# Patient Record
Sex: Female | Born: 1960 | Race: White | Hispanic: No | Marital: Married | Smoking: Never smoker
Health system: Southern US, Community
[De-identification: ages and names within clinical notes are randomized; demographics above are authoritative.]

## PROBLEM LIST (undated history)

## (undated) DIAGNOSIS — N8501 Benign endometrial hyperplasia: Secondary | ICD-10-CM

## (undated) DIAGNOSIS — Z9889 Other specified postprocedural states: Secondary | ICD-10-CM

## (undated) DIAGNOSIS — K219 Gastro-esophageal reflux disease without esophagitis: Secondary | ICD-10-CM

## (undated) DIAGNOSIS — C50919 Malignant neoplasm of unspecified site of unspecified female breast: Secondary | ICD-10-CM

## (undated) DIAGNOSIS — R112 Nausea with vomiting, unspecified: Secondary | ICD-10-CM

## (undated) DIAGNOSIS — D249 Benign neoplasm of unspecified breast: Secondary | ICD-10-CM

## (undated) HISTORY — DX: Other specified postprocedural states: Z98.890

## (undated) HISTORY — DX: Nausea with vomiting, unspecified: R11.2

## (undated) HISTORY — DX: Malignant neoplasm of unspecified site of unspecified female breast: C50.919

## (undated) HISTORY — DX: Gastro-esophageal reflux disease without esophagitis: K21.9

## (undated) HISTORY — DX: Benign neoplasm of unspecified breast: D24.9

## (undated) HISTORY — PX: COLONOSCOPY: SHX174

## (undated) HISTORY — DX: Benign endometrial hyperplasia: N85.01

---
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
## 1999-01-02 ENCOUNTER — Ambulatory Visit (HOSPITAL_COMMUNITY): Admission: RE | Admit: 1999-01-02 | Discharge: 1999-01-02 | Payer: Self-pay

## 2000-10-02 ENCOUNTER — Encounter: Payer: Self-pay | Admitting: Obstetrics and Gynecology

## 2000-10-02 ENCOUNTER — Encounter: Admission: RE | Admit: 2000-10-02 | Discharge: 2000-10-02 | Payer: Self-pay | Admitting: Obstetrics and Gynecology

## 2001-03-06 ENCOUNTER — Encounter: Payer: Self-pay | Admitting: Gynecology

## 2001-03-06 ENCOUNTER — Encounter: Admission: RE | Admit: 2001-03-06 | Discharge: 2001-03-06 | Payer: Self-pay | Admitting: Gynecology

## 2001-08-27 ENCOUNTER — Encounter: Admission: RE | Admit: 2001-08-27 | Discharge: 2001-08-27 | Payer: Self-pay | Admitting: Gynecology

## 2001-08-27 ENCOUNTER — Encounter: Payer: Self-pay | Admitting: Gynecology

## 2001-10-07 ENCOUNTER — Other Ambulatory Visit: Admission: RE | Admit: 2001-10-07 | Discharge: 2001-10-07 | Payer: Self-pay | Admitting: Gynecology

## 2001-10-14 ENCOUNTER — Encounter: Payer: Self-pay | Admitting: Gynecology

## 2001-10-14 ENCOUNTER — Encounter: Admission: RE | Admit: 2001-10-14 | Discharge: 2001-10-14 | Payer: Self-pay | Admitting: Gynecology

## 2002-10-22 ENCOUNTER — Other Ambulatory Visit: Admission: RE | Admit: 2002-10-22 | Discharge: 2002-10-22 | Payer: Self-pay | Admitting: Gynecology

## 2002-10-26 ENCOUNTER — Encounter: Admission: RE | Admit: 2002-10-26 | Discharge: 2002-10-26 | Payer: Self-pay | Admitting: Gynecology

## 2002-10-26 ENCOUNTER — Encounter: Payer: Self-pay | Admitting: Gynecology

## 2002-11-12 DIAGNOSIS — D249 Benign neoplasm of unspecified breast: Secondary | ICD-10-CM

## 2002-11-12 HISTORY — DX: Benign neoplasm of unspecified breast: D24.9

## 2003-04-22 ENCOUNTER — Encounter: Payer: Self-pay | Admitting: Gynecology

## 2003-04-22 ENCOUNTER — Encounter: Admission: RE | Admit: 2003-04-22 | Discharge: 2003-04-22 | Payer: Self-pay | Admitting: Gynecology

## 2003-11-16 ENCOUNTER — Other Ambulatory Visit: Admission: RE | Admit: 2003-11-16 | Discharge: 2003-11-16 | Payer: Self-pay | Admitting: Gynecology

## 2003-11-23 ENCOUNTER — Encounter: Admission: RE | Admit: 2003-11-23 | Discharge: 2003-11-23 | Payer: Self-pay | Admitting: Gynecology

## 2004-05-25 ENCOUNTER — Encounter: Admission: RE | Admit: 2004-05-25 | Discharge: 2004-05-25 | Payer: Self-pay | Admitting: Gynecology

## 2004-11-22 ENCOUNTER — Other Ambulatory Visit: Admission: RE | Admit: 2004-11-22 | Discharge: 2004-11-22 | Payer: Self-pay | Admitting: Gynecology

## 2004-12-13 ENCOUNTER — Encounter: Admission: RE | Admit: 2004-12-13 | Discharge: 2004-12-13 | Payer: Self-pay | Admitting: Gynecology

## 2005-06-14 ENCOUNTER — Encounter: Admission: RE | Admit: 2005-06-14 | Discharge: 2005-06-14 | Payer: Self-pay | Admitting: Gynecology

## 2005-12-11 ENCOUNTER — Other Ambulatory Visit: Admission: RE | Admit: 2005-12-11 | Discharge: 2005-12-11 | Payer: Self-pay | Admitting: Gynecology

## 2006-01-29 ENCOUNTER — Encounter: Admission: RE | Admit: 2006-01-29 | Discharge: 2006-01-29 | Payer: Self-pay | Admitting: Gynecology

## 2006-11-12 DIAGNOSIS — C50919 Malignant neoplasm of unspecified site of unspecified female breast: Secondary | ICD-10-CM

## 2006-11-12 HISTORY — DX: Malignant neoplasm of unspecified site of unspecified female breast: C50.919

## 2006-11-12 HISTORY — PX: OTHER SURGICAL HISTORY: SHX169

## 2007-02-05 ENCOUNTER — Other Ambulatory Visit: Admission: RE | Admit: 2007-02-05 | Discharge: 2007-02-05 | Payer: Self-pay | Admitting: Obstetrics and Gynecology

## 2007-02-19 ENCOUNTER — Encounter: Admission: RE | Admit: 2007-02-19 | Discharge: 2007-02-19 | Payer: Self-pay | Admitting: Obstetrics and Gynecology

## 2007-03-04 ENCOUNTER — Encounter: Admission: RE | Admit: 2007-03-04 | Discharge: 2007-03-04 | Payer: Self-pay | Admitting: Obstetrics and Gynecology

## 2007-03-04 ENCOUNTER — Encounter (INDEPENDENT_AMBULATORY_CARE_PROVIDER_SITE_OTHER): Payer: Self-pay | Admitting: Specialist

## 2007-03-10 ENCOUNTER — Ambulatory Visit: Payer: Self-pay | Admitting: Oncology

## 2007-03-10 ENCOUNTER — Encounter: Admission: RE | Admit: 2007-03-10 | Discharge: 2007-03-10 | Payer: Self-pay | Admitting: Obstetrics and Gynecology

## 2007-03-11 ENCOUNTER — Encounter: Admission: RE | Admit: 2007-03-11 | Discharge: 2007-03-11 | Payer: Self-pay | Admitting: Obstetrics and Gynecology

## 2007-03-11 ENCOUNTER — Encounter (INDEPENDENT_AMBULATORY_CARE_PROVIDER_SITE_OTHER): Payer: Self-pay | Admitting: Specialist

## 2007-03-12 ENCOUNTER — Ambulatory Visit (HOSPITAL_COMMUNITY): Admission: RE | Admit: 2007-03-12 | Discharge: 2007-03-12 | Payer: Self-pay | Admitting: Oncology

## 2007-03-18 ENCOUNTER — Ambulatory Visit (HOSPITAL_COMMUNITY): Admission: RE | Admit: 2007-03-18 | Discharge: 2007-03-18 | Payer: Self-pay | Admitting: Oncology

## 2007-03-19 LAB — CBC WITH DIFFERENTIAL/PLATELET
Basophils Absolute: 0 10*3/uL (ref 0.0–0.1)
EOS%: 0.6 % (ref 0.0–7.0)
Eosinophils Absolute: 0 10*3/uL (ref 0.0–0.5)
HCT: 41.2 % (ref 34.8–46.6)
HGB: 14.6 g/dL (ref 11.6–15.9)
LYMPH%: 23.9 % (ref 14.0–48.0)
MCH: 32.7 pg (ref 26.0–34.0)
MCV: 92.3 fL (ref 81.0–101.0)
MONO%: 6 % (ref 0.0–13.0)
NEUT%: 69.1 % (ref 39.6–76.8)
Platelets: 242 10*3/uL (ref 145–400)
RDW: 12.3 % (ref 11.3–14.5)

## 2007-03-19 LAB — COMPREHENSIVE METABOLIC PANEL
AST: 18 U/L (ref 0–37)
Alkaline Phosphatase: 41 U/L (ref 39–117)
BUN: 11 mg/dL (ref 6–23)
Creatinine, Ser: 0.85 mg/dL (ref 0.40–1.20)
Glucose, Bld: 133 mg/dL — ABNORMAL HIGH (ref 70–99)
Total Bilirubin: 0.5 mg/dL (ref 0.3–1.2)

## 2007-03-19 LAB — CANCER ANTIGEN 27.29: CA 27.29: 32 U/mL (ref 0–39)

## 2007-03-20 ENCOUNTER — Ambulatory Visit (HOSPITAL_COMMUNITY): Admission: RE | Admit: 2007-03-20 | Discharge: 2007-03-20 | Payer: Self-pay | Admitting: Oncology

## 2007-03-21 ENCOUNTER — Ambulatory Visit (HOSPITAL_COMMUNITY): Admission: RE | Admit: 2007-03-21 | Discharge: 2007-03-21 | Payer: Self-pay | Admitting: Oncology

## 2007-04-13 HISTORY — PX: OTHER SURGICAL HISTORY: SHX169

## 2007-04-23 ENCOUNTER — Ambulatory Visit (HOSPITAL_COMMUNITY): Admission: RE | Admit: 2007-04-23 | Discharge: 2007-04-25 | Payer: Self-pay | Admitting: Obstetrics and Gynecology

## 2007-04-23 ENCOUNTER — Encounter (INDEPENDENT_AMBULATORY_CARE_PROVIDER_SITE_OTHER): Payer: Self-pay | Admitting: Surgery

## 2007-04-29 ENCOUNTER — Ambulatory Visit: Payer: Self-pay | Admitting: Oncology

## 2007-05-19 ENCOUNTER — Ambulatory Visit: Admission: RE | Admit: 2007-05-19 | Discharge: 2007-06-17 | Payer: Self-pay | Admitting: Radiation Oncology

## 2007-07-31 ENCOUNTER — Ambulatory Visit: Payer: Self-pay | Admitting: Oncology

## 2007-09-25 IMAGING — CR DG CHEST 2V
2 series · 2 of 2 positions shown · non-contrast
Comparison: none

CLINICAL DATA: Breast cancer.  Pre-op.
 CHEST - 2 VIEW:

[view not recorded (1 of 2)]
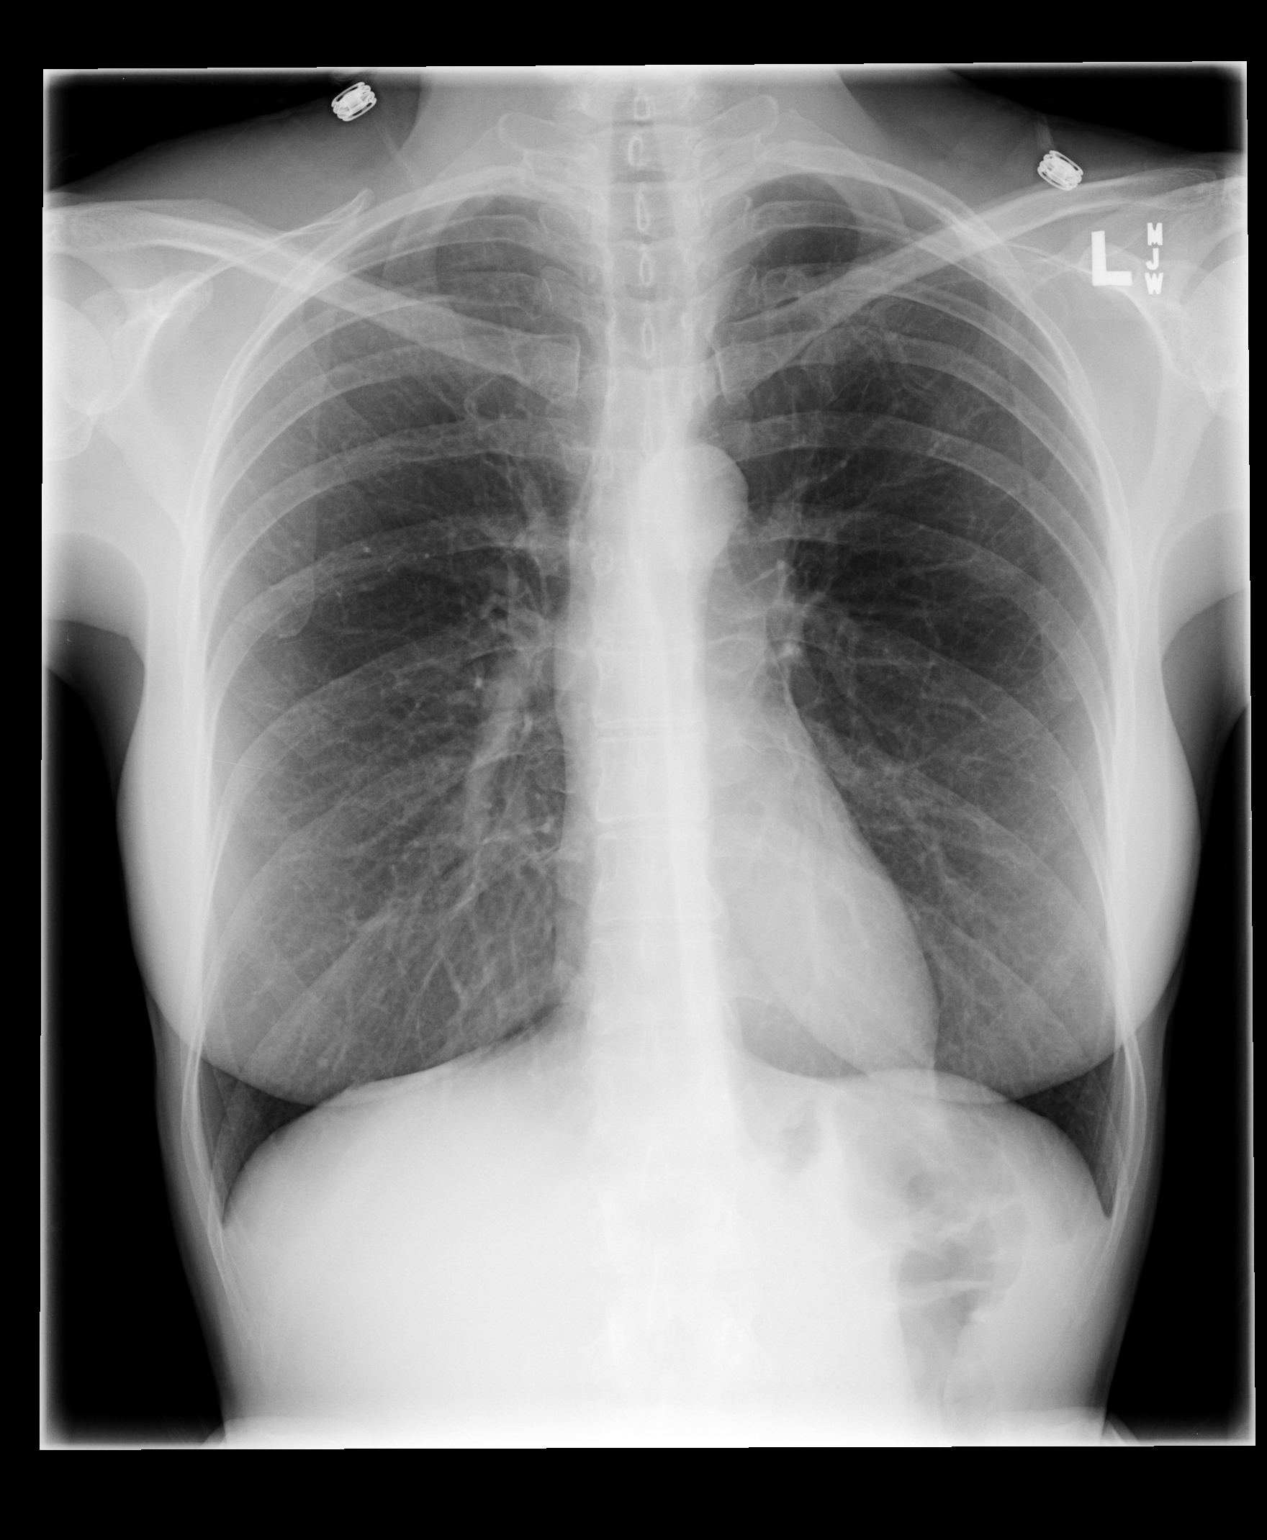

[view not recorded (2 of 2)]
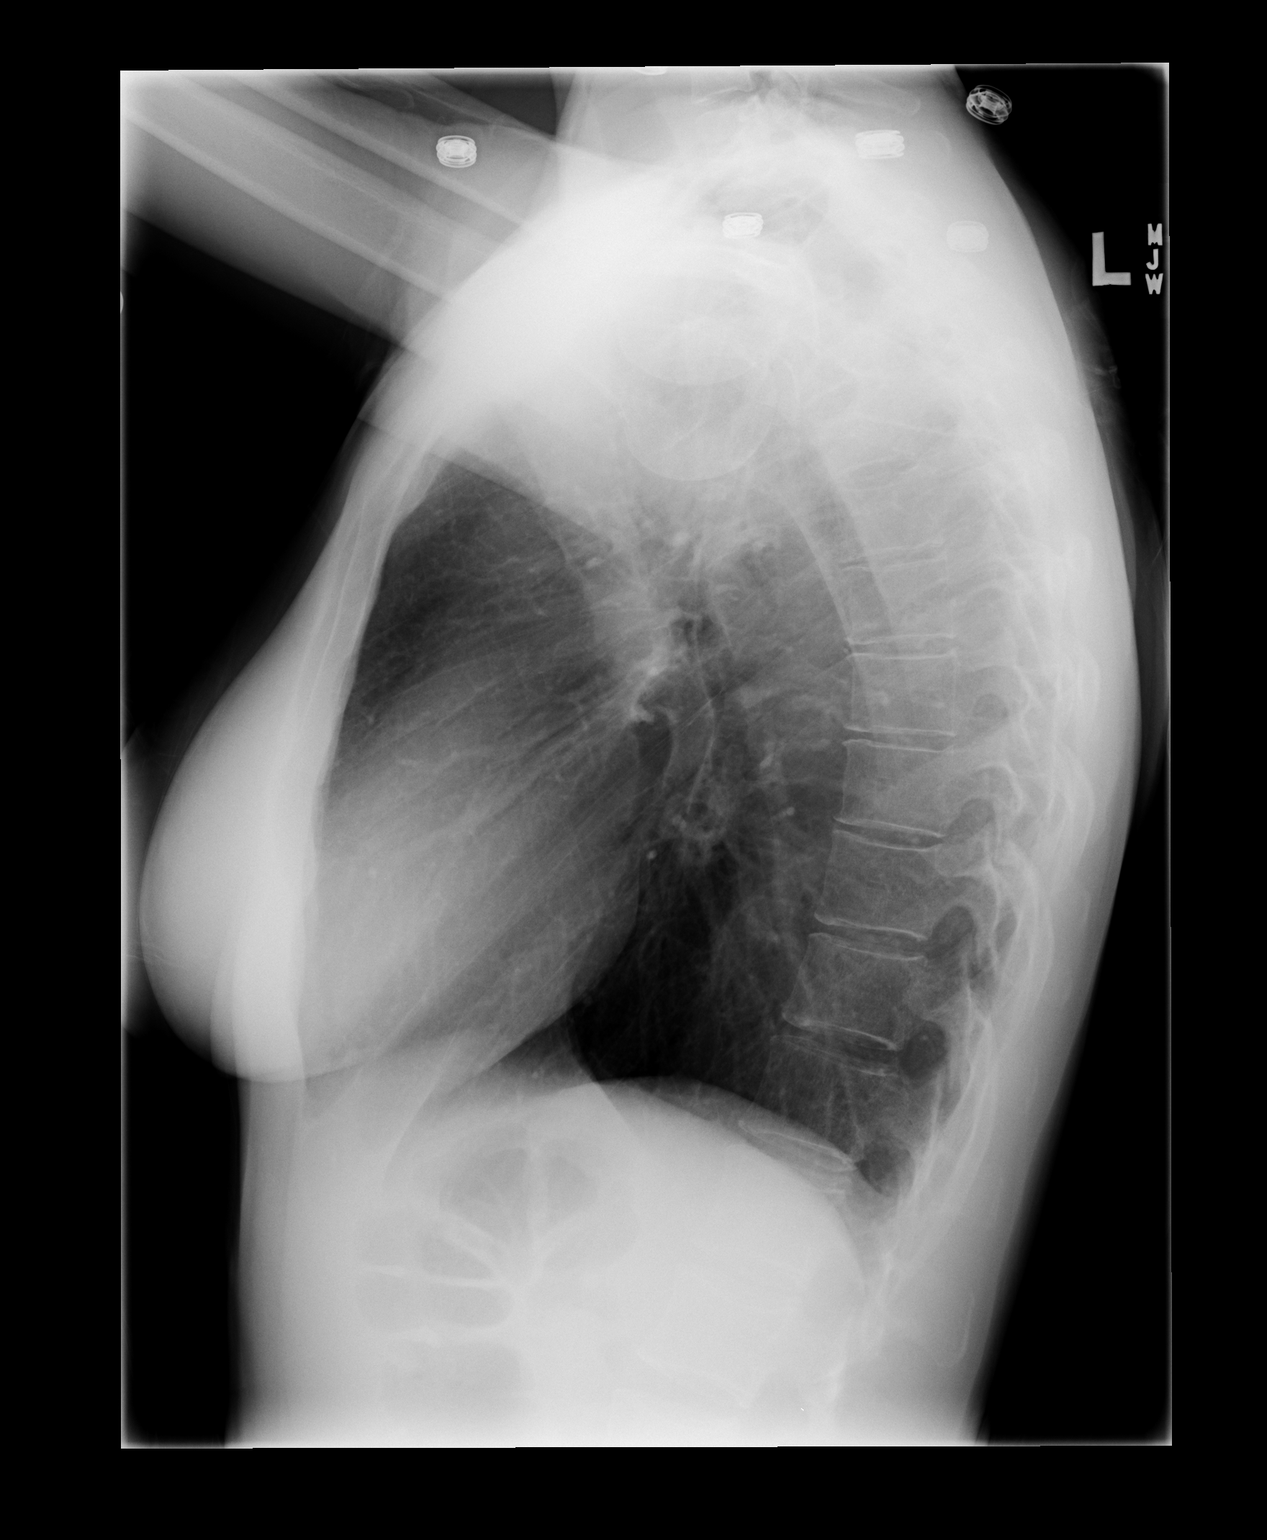

[2 of 2 positions shown; findings below may reference images not displayed]

FINDINGS: Heart size is normal.  There is no infiltrate, effusion or mass.  No lung nodules are identified.
IMPRESSION: No acute abnormality.

## 2008-01-16 ENCOUNTER — Ambulatory Visit: Payer: Self-pay | Admitting: Oncology

## 2008-01-20 LAB — CBC WITH DIFFERENTIAL/PLATELET
BASO%: 0.4 % (ref 0.0–2.0)
Basophils Absolute: 0 10*3/uL (ref 0.0–0.1)
EOS%: 2.3 % (ref 0.0–7.0)
HCT: 37 % (ref 34.8–46.6)
HGB: 12.9 g/dL (ref 11.6–15.9)
MCH: 32.6 pg (ref 26.0–34.0)
MCHC: 35 g/dL (ref 32.0–36.0)
MONO#: 0.4 10*3/uL (ref 0.1–0.9)
NEUT%: 60.7 % (ref 39.6–76.8)
RDW: 12.4 % (ref 11.3–14.5)
WBC: 6.1 10*3/uL (ref 3.9–10.0)
lymph#: 1.9 10*3/uL (ref 0.9–3.3)

## 2008-01-21 LAB — COMPREHENSIVE METABOLIC PANEL
ALT: 17 U/L (ref 0–35)
AST: 22 U/L (ref 0–37)
CO2: 28 mEq/L (ref 19–32)
Chloride: 103 mEq/L (ref 96–112)
Sodium: 140 mEq/L (ref 135–145)
Total Bilirubin: 0.4 mg/dL (ref 0.3–1.2)
Total Protein: 6.6 g/dL (ref 6.0–8.3)

## 2008-01-21 LAB — VITAMIN D 25 HYDROXY (VIT D DEFICIENCY, FRACTURES): Vit D, 25-Hydroxy: 39 ng/mL (ref 30–89)

## 2008-01-23 LAB — VITAMIN D 1,25 DIHYDROXY: Vit D, 1,25-Dihydroxy: 63 pg/mL — ABNORMAL HIGH (ref 6–62)

## 2008-02-11 ENCOUNTER — Other Ambulatory Visit: Admission: RE | Admit: 2008-02-11 | Discharge: 2008-02-11 | Payer: Self-pay | Admitting: Obstetrics and Gynecology

## 2008-07-27 ENCOUNTER — Ambulatory Visit: Payer: Self-pay | Admitting: Oncology

## 2008-07-29 LAB — CBC WITH DIFFERENTIAL/PLATELET
Basophils Absolute: 0 10*3/uL (ref 0.0–0.1)
EOS%: 0.9 % (ref 0.0–7.0)
Eosinophils Absolute: 0 10*3/uL (ref 0.0–0.5)
HCT: 39.3 % (ref 34.8–46.6)
HGB: 14 g/dL (ref 11.6–15.9)
MONO#: 0.3 10*3/uL (ref 0.1–0.9)
NEUT#: 2.6 10*3/uL (ref 1.5–6.5)
NEUT%: 57.6 % (ref 39.6–76.8)
RDW: 12.5 % (ref 11.3–14.5)
WBC: 4.5 10*3/uL (ref 3.9–10.0)
lymph#: 1.6 10*3/uL (ref 0.9–3.3)

## 2008-07-30 LAB — COMPREHENSIVE METABOLIC PANEL
Alkaline Phosphatase: 41 U/L (ref 39–117)
BUN: 11 mg/dL (ref 6–23)
CO2: 27 mEq/L (ref 19–32)
Glucose, Bld: 96 mg/dL (ref 70–99)
Total Bilirubin: 0.4 mg/dL (ref 0.3–1.2)

## 2008-07-30 LAB — VITAMIN D 25 HYDROXY (VIT D DEFICIENCY, FRACTURES): Vit D, 25-Hydroxy: 43 ng/mL (ref 30–89)

## 2008-07-30 LAB — CANCER ANTIGEN 27.29: CA 27.29: 29 U/mL (ref 0–39)

## 2009-01-24 ENCOUNTER — Ambulatory Visit: Payer: Self-pay | Admitting: Oncology

## 2009-01-26 LAB — COMPREHENSIVE METABOLIC PANEL
Alkaline Phosphatase: 28 U/L — ABNORMAL LOW (ref 39–117)
CO2: 29 mEq/L (ref 19–32)
Creatinine, Ser: 0.8 mg/dL (ref 0.40–1.20)
Glucose, Bld: 90 mg/dL (ref 70–99)
Sodium: 139 mEq/L (ref 135–145)
Total Bilirubin: 0.5 mg/dL (ref 0.3–1.2)
Total Protein: 6.7 g/dL (ref 6.0–8.3)

## 2009-01-26 LAB — CBC WITH DIFFERENTIAL/PLATELET
Eosinophils Absolute: 0.1 10*3/uL (ref 0.0–0.5)
HCT: 37.4 % (ref 34.8–46.6)
LYMPH%: 35.8 % (ref 14.0–49.7)
MCHC: 35.5 g/dL (ref 31.5–36.0)
MCV: 95.2 fL (ref 79.5–101.0)
MONO#: 0.3 10*3/uL (ref 0.1–0.9)
MONO%: 6 % (ref 0.0–14.0)
NEUT%: 56 % (ref 38.4–76.8)
Platelets: 180 10*3/uL (ref 145–400)
RBC: 3.93 10*6/uL (ref 3.70–5.45)
WBC: 4.9 10*3/uL (ref 3.9–10.3)

## 2009-01-27 LAB — VITAMIN D 25 HYDROXY (VIT D DEFICIENCY, FRACTURES): Vit D, 25-Hydroxy: 39 ng/mL (ref 30–89)

## 2009-01-27 LAB — CANCER ANTIGEN 27.29: CA 27.29: 25 U/mL (ref 0–39)

## 2009-02-22 ENCOUNTER — Other Ambulatory Visit: Admission: RE | Admit: 2009-02-22 | Discharge: 2009-02-22 | Payer: Self-pay | Admitting: Obstetrics and Gynecology

## 2009-07-13 ENCOUNTER — Ambulatory Visit: Payer: Self-pay | Admitting: Oncology

## 2009-07-15 LAB — CBC WITH DIFFERENTIAL/PLATELET
Basophils Absolute: 0 10*3/uL (ref 0.0–0.1)
Eosinophils Absolute: 0.1 10*3/uL (ref 0.0–0.5)
HCT: 36.7 % (ref 34.8–46.6)
LYMPH%: 33.3 % (ref 14.0–49.7)
MONO#: 0.3 10*3/uL (ref 0.1–0.9)
NEUT#: 2.9 10*3/uL (ref 1.5–6.5)
NEUT%: 58.5 % (ref 38.4–76.8)
Platelets: 179 10*3/uL (ref 145–400)
WBC: 5 10*3/uL (ref 3.9–10.3)

## 2009-07-19 LAB — CANCER ANTIGEN 27.29: CA 27.29: 21 U/mL (ref 0–39)

## 2009-07-19 LAB — COMPREHENSIVE METABOLIC PANEL
BUN: 11 mg/dL (ref 6–23)
CO2: 25 mEq/L (ref 19–32)
Creatinine, Ser: 0.87 mg/dL (ref 0.40–1.20)
Glucose, Bld: 109 mg/dL — ABNORMAL HIGH (ref 70–99)
Total Bilirubin: 0.4 mg/dL (ref 0.3–1.2)
Total Protein: 6.6 g/dL (ref 6.0–8.3)

## 2009-07-19 LAB — LACTATE DEHYDROGENASE: LDH: 182 U/L (ref 94–250)

## 2010-01-20 ENCOUNTER — Ambulatory Visit: Payer: Self-pay | Admitting: Oncology

## 2010-01-24 LAB — CBC WITH DIFFERENTIAL/PLATELET
BASO%: 0.2 % (ref 0.0–2.0)
EOS%: 1.1 % (ref 0.0–7.0)
LYMPH%: 35.5 % (ref 14.0–49.7)
MCH: 32.4 pg (ref 25.1–34.0)
MCHC: 34.9 g/dL (ref 31.5–36.0)
MCV: 92.8 fL (ref 79.5–101.0)
MONO%: 6.2 % (ref 0.0–14.0)
Platelets: 163 10*3/uL (ref 145–400)
RBC: 4.17 10*6/uL (ref 3.70–5.45)
WBC: 5.6 10*3/uL (ref 3.9–10.3)

## 2010-01-24 LAB — COMPREHENSIVE METABOLIC PANEL
ALT: 19 U/L (ref 0–35)
Alkaline Phosphatase: 42 U/L (ref 39–117)
Creatinine, Ser: 0.85 mg/dL (ref 0.40–1.20)
Sodium: 139 mEq/L (ref 135–145)
Total Bilirubin: 0.4 mg/dL (ref 0.3–1.2)
Total Protein: 6.7 g/dL (ref 6.0–8.3)

## 2010-07-27 ENCOUNTER — Ambulatory Visit: Payer: Self-pay | Admitting: Oncology

## 2010-07-27 LAB — CBC WITH DIFFERENTIAL/PLATELET
EOS%: 1.2 % (ref 0.0–7.0)
Eosinophils Absolute: 0 10*3/uL (ref 0.0–0.5)
LYMPH%: 36.4 % (ref 14.0–49.7)
MCH: 33.1 pg (ref 25.1–34.0)
MCHC: 35 g/dL (ref 31.5–36.0)
MCV: 94.6 fL (ref 79.5–101.0)
MONO%: 7.8 % (ref 0.0–14.0)
NEUT#: 2.1 10*3/uL (ref 1.5–6.5)
Platelets: 190 10*3/uL (ref 145–400)
RBC: 3.94 10*6/uL (ref 3.70–5.45)

## 2010-07-28 LAB — COMPREHENSIVE METABOLIC PANEL
AST: 23 U/L (ref 0–37)
Alkaline Phosphatase: 40 U/L (ref 39–117)
Glucose, Bld: 104 mg/dL — ABNORMAL HIGH (ref 70–99)
Sodium: 140 mEq/L (ref 135–145)
Total Bilirubin: 0.4 mg/dL (ref 0.3–1.2)
Total Protein: 6.3 g/dL (ref 6.0–8.3)

## 2010-07-28 LAB — FOLLICLE STIMULATING HORMONE: FSH: 40.6 m[IU]/mL

## 2010-07-28 LAB — VITAMIN D 25 HYDROXY (VIT D DEFICIENCY, FRACTURES): Vit D, 25-Hydroxy: 47 ng/mL (ref 30–89)

## 2011-01-25 ENCOUNTER — Other Ambulatory Visit: Payer: Self-pay | Admitting: Oncology

## 2011-01-25 ENCOUNTER — Encounter (HOSPITAL_BASED_OUTPATIENT_CLINIC_OR_DEPARTMENT_OTHER): Payer: 59 | Admitting: Oncology

## 2011-01-25 DIAGNOSIS — Z170 Estrogen receptor positive status [ER+]: Secondary | ICD-10-CM

## 2011-01-25 DIAGNOSIS — C50419 Malignant neoplasm of upper-outer quadrant of unspecified female breast: Secondary | ICD-10-CM

## 2011-01-25 LAB — CBC WITH DIFFERENTIAL/PLATELET
BASO%: 0.2 % (ref 0.0–2.0)
Basophils Absolute: 0 10*3/uL (ref 0.0–0.1)
EOS%: 1.5 % (ref 0.0–7.0)
HCT: 37.7 % (ref 34.8–46.6)
HGB: 13.3 g/dL (ref 11.6–15.9)
LYMPH%: 41.3 % (ref 14.0–49.7)
MCH: 32.1 pg (ref 25.1–34.0)
MCHC: 35.3 g/dL (ref 31.5–36.0)
MONO#: 0.3 10*3/uL (ref 0.1–0.9)
NEUT%: 50.6 % (ref 38.4–76.8)
Platelets: 163 10*3/uL (ref 145–400)

## 2011-01-25 LAB — COMPREHENSIVE METABOLIC PANEL
AST: 25 U/L (ref 0–37)
Albumin: 3.9 g/dL (ref 3.5–5.2)
Alkaline Phosphatase: 39 U/L (ref 39–117)
Glucose, Bld: 94 mg/dL (ref 70–99)
Total Protein: 6.4 g/dL (ref 6.0–8.3)

## 2011-01-26 LAB — VITAMIN D 25 HYDROXY (VIT D DEFICIENCY, FRACTURES): Vit D, 25-Hydroxy: 50 ng/mL (ref 30–89)

## 2011-02-01 ENCOUNTER — Encounter (HOSPITAL_BASED_OUTPATIENT_CLINIC_OR_DEPARTMENT_OTHER): Payer: 59 | Admitting: Oncology

## 2011-02-01 DIAGNOSIS — C50419 Malignant neoplasm of upper-outer quadrant of unspecified female breast: Secondary | ICD-10-CM

## 2011-02-01 DIAGNOSIS — Z170 Estrogen receptor positive status [ER+]: Secondary | ICD-10-CM

## 2011-03-27 NOTE — Discharge Summary (Signed)
Erika Parsons, Erika Parsons NO.:  [id number]   MEDICAL RECORD NO.:  [id number]          PATIENT TYPE:  OIB   LOCATION:  5738                         FACILITY:  [facility]   PHYSICIAN:  Etter Sjogren, M.D.     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   PROCEDURE PERFORMED:  1. Bilateral mastectomy.  2. Right sentinel lymph node biopsy.  3. Bilateral breast reconstruction using tissue expanders.   See H&P.   HISTORY OF PRESENT ILLNESS:  A 50 year old woman with breast cancer who  desires reconstruction, having had mastectomy recommended to her.  The  nature of the procedure was discussed with her at length, and she  understood the risks and complications and wished to proceed.  For  further details of the patient's history and physical, please see the  chart.   HOSPITAL COURSE:  On admission, her metabolic panel was within normal  limits.  Alkaline phosphatase is a little low at 35.  White blood cell  count, hemoglobin and hematocrit are all within normal limits.  On the  day of admission she was taken to surgery, at which time the mastectomy,  bilateral, and breast reconstruction were performed.  It was tolerated  well.  Postoperatively she did well, afebrile, drains functioning.  Skin  flaps looked very healthy.  Ambulating well.  It was anticipated that  she would be discharged on the second postoperative day.   DISPOSITION:  She is discharged on the Percocet for pain, Robaxin, and  Keflex.  No shower yet.  Empty the drains three times a day and record  the amount.  Use incentive spirometer at home.  Will recheck next week.      Etter Sjogren, M.D.  Electronically Signed     DB/MEDQ  D:  04/25/2007  T:  04/25/2007  Job:  [id number]

## 2011-03-27 NOTE — Op Note (Signed)
Erika Parsons, Erika Parsons              ACCOUNT NO.:  [id number]   MEDICAL RECORD NO.:  [id number]          PATIENT TYPE:  OIB   LOCATION:  5738                         FACILITY:  [facility]   PHYSICIAN:  Sandria Bales. Ezzard Standing, M.D.  DATE OF BIRTH:  10-Jul-1961   DATE OF PROCEDURE:  04/23/2007  DATE OF DISCHARGE:                               OPERATIVE REPORT   PREOPERATIVE DIAGNOSES:  Approximately 2.7-cm carcinoma in upper outer  quadrant of right breast, with bilateral severe fibronodular breast  disease.   POSTOPERATIVE DIAGNOSES:  Approximately 2.7-cm carcinoma in upper outer  quadrant of right breast, severe bilateral fibronodular breast disease,  negative sentinel lymph nodes of right axilla.   PROCEDURES:  Bilateral simple mastectomies; right axillary sentinel  lymph node biopsy with counts of 2400, background counts 100; and  injection of methylene blue.  Dr. Odis Luster to place bilateral tissue expanders and will dictate that  portion of surgery.   SURGEON:  Sandria Bales. Ezzard Standing, M.D.   ANESTHESIA:  General.   ESTIMATED BLOOD LOSS:  150 mL.   INDICATIONS FOR PROCEDURE:  Erika Parsons is a 50 year old white female,  who is a patient of Dr. Purvis Sheffield, who on a recent physical exam and  ultrasound was found to have a mass in the upper outer quadrant of her  right breast, which measured 2.7 cm by ultrasound.  She underwent a core  biopsy, which showed an invasive mammary carcinoma.  The completing  prophylaxis with Erika Parsons is she has severe fibronodular disease of  both breasts, which made physical exam and radiologic examination  extremely difficult.   We talked out neoadjuvant therapy versus lumpectomy versus mastectomy.  She is seeing Dr. Marikay Alar Magrinat in consultation, and the agreement was  that because of her severe bilateral fibronodular disease and the  extreme difficulty in examining and following her breasts, she would be  best served with a mastectomy on the side of the  cancer and a  prophylactic mastectomy on the other side.  And we did do gene testing  and her BRCA1 and 2 have come back negative and she is aware of this.  We also talked about doing an axillary sentinel lymph node; if this is  positive, we go into a full axillary dissection.   The indications and potential complications of surgery were explained to  the patient.  The potential complications, again, include bleeding, of  which she is aware and does not want blood products transfused,  infection, nerve injury, lymphedema and recurrence of the tumor.   OPERATIVE NOTE:  The patient was placed in a supine position, given a  general endotracheal anesthetic.  She was given 1 g of Ancef at the  initiation of the procedure.  She had a Foley catheter in place.  She  had been marked by Dr. Odis Luster before, and I remarked the inframammary  folds of both breasts.  She had also been injected with 1 mCi of Sulfur  Colloid in the right breast for the sentinel lymph node, and I injected  about 2 mL of 40% methylene blue in the right breast.  I started with her benign side first, the left breast.  I made an  incision, which was elliptical, approximately 8 to 9 cm in length,  including her nipple.  I slanted these slightly towards her axilla  because of the size of her breasts, and took this out sharply,  developing flaps, medially to the sternum, inferiorly to the rectus  muscle, laterally to the latissimus dorsi, and superiorly to about 2  fingerbreadths below the clavicle.   I then excised the breast off the chest wall.  I put a suture in the  lateral aspect of the breast.  It was impressive how nodular this breast  was but how it came out as 1 unit, and there was very little  subcutaneous fat on her.   I then turned my attention to the opposite breast, again, making an  elliptical incision including the nipple, which was about 8 or 9 cm  slightly towards the axilla.  I had already identified a  right axillary  lymph node, which was hot.  I developed my upper flaps, and then I found  this and identified this sentinel lymph node with counts of about 2400  and background of about 100.  There actually were 2 nodes, which were  blue and hot, side by side.  I sent these off to [facility].  Dr. Laureen Ochs  said these were benign lymph nodes on touch prep, with final pathology  pending.   I then continued my breast dissection again, going cranial towards the  clavicle, about 2 fingerbreadths below the clavicle, medial to the  lateral edge of the sternum, inferiorly to the invested fascia of the  rectus muscle, laterally to the latissimus dorsi muscle.  Again, the  breast was 1 hard unit.  I did try to excise the skin at least kind of  partly over where this tumor was palpated, because her tumor was sort of  about the 10-o'clock position.  I again put a suture in the breast,  marking the lateral aspect of the right breast, and then excised this.   At this point, both breasts had been removed.  There was no bleeding.  The estimated blood loss was maybe about 100 to 150 mL, and Dr. Etter Sjogren will scrub in with plans to place tissue expanders and close the  wounds, and he will dictate from here on as far as the operation.   The patient tolerated this part of the procedure well.      Sandria Bales. Ezzard Standing, M.D.  Electronically Signed     DHN/MEDQ  D:  04/23/2007  T:  04/23/2007  Job:  [id number]   cc:   Valentino Hue. Magrinat, M.D.  Cynthia P. Romine, M.D.  Etter Sjogren, M.D.  Dr Anselmo Pickler

## 2011-03-27 NOTE — Op Note (Signed)
Erika Parsons, Erika NO.:  [id number]   MEDICAL RECORD NO.:  [id number]          PATIENT TYPE:  OIB   LOCATION:  5738                         FACILITY:  [facility]   PHYSICIAN:  Etter Sjogren, M.D.     DATE OF BIRTH:  02-04-1961   DATE OF PROCEDURE:  04/23/2007  DATE OF DISCHARGE:                               OPERATIVE REPORT   PREOPERATIVE DIAGNOSIS:  Breast cancer with bilateral acquired absence  of the breast.   POSTOPERATIVE DIAGNOSIS:  Breast cancer with bilateral acquired absence  of the breast.   PROCEDURE PERFORMED:  Bilateral breast reconstruction with tissue  expanders.   SURGEON:  Etter Sjogren, M.D.   ANESTHESIA:  General.   ESTIMATED BLOOD LOSS:  Minimal.   DRAINS:  Two Blakes on each side.   CLINICAL NOTE:  A 50 year old woman with right breast cancer, has a very  large tumor, very difficult breast to examine and she decided to have  bilateral mastectomy.  This was felt to be medically necessary.  She  desired breast reconstruction.  Options discussed.  She selected tissue  expansion as a planned stage procedure by placement of the implants.  She understood the procedure, the risks, the possible complications, the  overall convalescence and the fact that these were planned stage  procedures and wished to proceed.   DESCRIPTION OF PROCEDURE:  The bilateral mastectomies had been completed  by Dr. Ovidio Kin.  The dissection was carried deep through the  pectoralis major muscles.  Submuscular pocket developed.  Thorough  irrigation with saline, meticulous hemostasis with electrocautery.  The  HD Flex from the [employer] was then placed with the dermal side up  towards the overlying skin flap and secured with a running 3-0 PDS  suture to the lateral border of the pectoralis major muscle.  Again,  after irrigation with saline, Blake drains in position, brought through  separate stab wounds inferiorly and secured with 3-0 plain sutures.  The  tissue expander was prepared after thoroughly cleaning gloves.  The  tissue expanders were prepared by removing the air and placing 100 mL of  sterile saline in the closed filling system.  The expanders were soaked  in antibiotic solution for greater than 5 minutes.  These were Allergan  tissue expanders 400 mL on the left side lot #[id number], on the right  side lot #[id number].  The 100 mL of sterile saline had been placed.  The  implants were positioned.  Care was taken to position them all the way  down to the inferiormost aspect of the submuscular pocket at the  inframammary fold.  Care was taken to make sure there were no wrinkles  or irregularities in the positioning.  This being the case, the lateral  border of the HD Flex was then secured to the musculature using 3-0 PDS  simple running suture, taking great care to avoid damage to the  underlying tissue expander.  Excellent hemostasis having been confirmed,  the skin closure with a few 3-0 Monocryl interrupted inverted deep  dermal sutures and a running 2-0 PDO Quill suture.  Steri-Strips, dry  sterile dressing and a protector applied, transported to the recovery  having tolerated the procedure well.      Etter Sjogren, M.D.  Electronically Signed     DB/MEDQ  D:  04/23/2007  T:  04/23/2007  Job:  [id number]

## 2011-05-17 ENCOUNTER — Encounter: Payer: Self-pay | Admitting: Gastroenterology

## 2011-06-21 ENCOUNTER — Ambulatory Visit (AMBULATORY_SURGERY_CENTER): Payer: 59 | Admitting: *Deleted

## 2011-06-21 ENCOUNTER — Encounter: Payer: Self-pay | Admitting: Gastroenterology

## 2011-06-21 VITALS — Ht 64.0 in | Wt 119.9 lb

## 2011-06-21 DIAGNOSIS — Z1211 Encounter for screening for malignant neoplasm of colon: Secondary | ICD-10-CM

## 2011-06-21 MED ORDER — PEG-KCL-NACL-NASULF-NA ASC-C 100 G PO SOLR: ORAL | Status: DC

## 2011-06-22 ENCOUNTER — Telehealth: Payer: Self-pay | Admitting: Gastroenterology

## 2011-06-22 NOTE — Telephone Encounter (Signed)
PATIENT DID NOT UNDERSTAND THE LIVING WILL POLICY INFORMATION THAT WAS GIVEN TO HER YESTERDAY IN PREVISIT. I DID EXPLAINED THIS TO HER AND SHE STATES SHE FEELS BETTER ABOUT THAT INFORMATION NOW. SHE ALSO STATES SHE IS A JEHOVAH'S  WITNESS AND SHE WILL BRING IN HER DOCUMENT THE DAY OF EXAM FOR [place] TO MAKE A COPY AND PLACE IN HER CHART.

## 2011-07-05 ENCOUNTER — Encounter: Payer: Self-pay | Admitting: Gastroenterology

## 2011-07-05 ENCOUNTER — Ambulatory Visit (AMBULATORY_SURGERY_CENTER): Payer: 59 | Admitting: Gastroenterology

## 2011-07-05 VITALS — BP 128/88 | HR 69 | Temp 98.0°F | Resp 16 | Ht 64.0 in | Wt 119.0 lb

## 2011-07-05 DIAGNOSIS — Z1211 Encounter for screening for malignant neoplasm of colon: Secondary | ICD-10-CM

## 2011-07-05 MED ORDER — SODIUM CHLORIDE 0.9 % IV SOLN: 500.0000 mL | INTRAVENOUS | Status: DC

## 2011-07-05 NOTE — Patient Instructions (Signed)
Please refer to blue and green discharge instruction sheets. 

## 2011-07-06 ENCOUNTER — Telehealth: Payer: Self-pay

## 2011-07-06 NOTE — Telephone Encounter (Signed)
I spoke with the pt's husband and he said she did okay and had left for work.  I asked him to please let her know we called to check on her and for her to call if she needs [place]. MAW

## 2011-07-12 ENCOUNTER — Other Ambulatory Visit: Payer: Self-pay | Admitting: Oncology

## 2011-07-12 ENCOUNTER — Encounter (HOSPITAL_BASED_OUTPATIENT_CLINIC_OR_DEPARTMENT_OTHER): Payer: 59 | Admitting: Oncology

## 2011-07-12 DIAGNOSIS — Z170 Estrogen receptor positive status [ER+]: Secondary | ICD-10-CM

## 2011-07-12 DIAGNOSIS — C50419 Malignant neoplasm of upper-outer quadrant of unspecified female breast: Secondary | ICD-10-CM

## 2011-07-12 LAB — CBC WITH DIFFERENTIAL/PLATELET
Basophils Absolute: 0 10*3/uL (ref 0.0–0.1)
EOS%: 2.6 % (ref 0.0–7.0)
HCT: 37.3 % (ref 34.8–46.6)
HGB: 13.2 g/dL (ref 11.6–15.9)
LYMPH%: 35.8 % (ref 14.0–49.7)
MCH: 33.4 pg (ref 25.1–34.0)
MCV: 94.3 fL (ref 79.5–101.0)
MONO%: 6.4 % (ref 0.0–14.0)
NEUT%: 54.7 % (ref 38.4–76.8)

## 2011-07-12 LAB — COMPREHENSIVE METABOLIC PANEL
AST: 25 U/L (ref 0–37)
Alkaline Phosphatase: 50 U/L (ref 39–117)
BUN: 10 mg/dL (ref 6–23)
Calcium: 9 mg/dL (ref 8.4–10.5)
Chloride: 98 mEq/L (ref 96–112)
Creatinine, Ser: 0.99 mg/dL (ref 0.50–1.10)

## 2011-07-13 LAB — VITAMIN D 25 HYDROXY (VIT D DEFICIENCY, FRACTURES): Vit D, 25-Hydroxy: 51 ng/mL (ref 30–89)

## 2011-08-02 ENCOUNTER — Encounter (HOSPITAL_BASED_OUTPATIENT_CLINIC_OR_DEPARTMENT_OTHER): Payer: 59 | Admitting: Oncology

## 2011-08-02 DIAGNOSIS — Z170 Estrogen receptor positive status [ER+]: Secondary | ICD-10-CM

## 2011-08-02 DIAGNOSIS — C50919 Malignant neoplasm of unspecified site of unspecified female breast: Secondary | ICD-10-CM

## 2011-08-30 LAB — COMPREHENSIVE METABOLIC PANEL
ALT: 13
AST: 20
Alkaline Phosphatase: 35 — ABNORMAL LOW
CO2: 30
Chloride: 100
GFR calc non Af Amer: >60
Glucose, Bld: 99
Potassium: 3.8
Sodium: 138

## 2011-08-30 LAB — CBC
Hemoglobin: 14.2
RBC: 4.33
WBC: 5.6

## 2011-08-30 LAB — DIFFERENTIAL
Basophils Relative: 0
Eosinophils Absolute: 0.3
Eosinophils Relative: 6 — ABNORMAL HIGH
Neutrophils Relative %: 61

## 2012-03-11 ENCOUNTER — Other Ambulatory Visit: Payer: Self-pay | Admitting: *Deleted

## 2012-03-11 DIAGNOSIS — C50419 Malignant neoplasm of upper-outer quadrant of unspecified female breast: Secondary | ICD-10-CM

## 2012-03-11 MED ORDER — TAMOXIFEN CITRATE 20 MG PO TABS: 20.0000 mg | ORAL_TABLET | Freq: Every day | ORAL | Status: DC

## 2012-04-22 ENCOUNTER — Telehealth: Payer: Self-pay | Admitting: *Deleted

## 2012-04-22 ENCOUNTER — Other Ambulatory Visit: Payer: Self-pay | Admitting: *Deleted

## 2012-04-22 NOTE — Telephone Encounter (Signed)
Pt called stating concern relating to recent GYN exam and tamoxifen.  Per Darl Pikes she had GYN exam and was informed she has a "lime size fibroid - which is totally new ".  Aylee is requesting to stop the tamoxifen and start a different medication.  Above discussed with this RN informing pt to proceed with stopping tamoxifen today- usual protocol is for pt to stay off tamoxifen for at least 6 weeks before initiating a new medication.  Pt's questions answered per her inquiry of names of possible medications.  This note will be given to MD for review and follow up.

## 2012-04-23 ENCOUNTER — Telehealth: Payer: Self-pay | Admitting: Oncology

## 2012-04-23 NOTE — Telephone Encounter (Signed)
lmonvm adviisng the pt of her f/u appts for aug

## 2012-05-02 ENCOUNTER — Ambulatory Visit (INDEPENDENT_AMBULATORY_CARE_PROVIDER_SITE_OTHER): Payer: PRIVATE HEALTH INSURANCE | Admitting: Surgery

## 2012-05-02 ENCOUNTER — Encounter (INDEPENDENT_AMBULATORY_CARE_PROVIDER_SITE_OTHER): Payer: Self-pay | Admitting: Surgery

## 2012-05-02 VITALS — BP 110/72 | HR 72 | Temp 98.8°F | Resp 16 | Ht 64.0 in | Wt 121.1 lb

## 2012-05-02 DIAGNOSIS — C50919 Malignant neoplasm of unspecified site of unspecified female breast: Secondary | ICD-10-CM

## 2012-05-02 DIAGNOSIS — Z853 Personal history of malignant neoplasm of breast: Secondary | ICD-10-CM | POA: Insufficient documentation

## 2012-05-02 DIAGNOSIS — N9489 Other specified conditions associated with female genital organs and menstrual cycle: Secondary | ICD-10-CM

## 2012-05-02 DIAGNOSIS — N858 Other specified noninflammatory disorders of uterus: Secondary | ICD-10-CM

## 2012-05-02 NOTE — Progress Notes (Addendum)
[facility]  Ovidio Kin, MD,  FACS [address]  [address] [place], [place]    [address] Phone:  [phone] FAX:  [phone]   Re:   Erika Parsons DOB:   1961/03/16 MRN:   [id number]  ASSESSMENT AND PLAN: 1.  Right breast cancer.  T2, N49mic  2.8 cm IDC, grade 1/3.  41mic/2 nodes.   ER - 91%, PR - 99%, Her2Neu - neg, Ki67 - 5%.  Bilateral mastectomies - 04/23/2007  Bilateral reconstruction by Dr. Leandrew Koyanagi - 04/23/2007  Oncotype DX - 13.  Just stopped tamoxifen due to concerns with uterus.  Sees Dr. Darnelle Catalan.  NED.  I gave her one of our breast cancer pins.  To see me back in one year.  [Dr. Darnelle Catalan has stopped seeing her.  DN 10/13/2012]  2.  Found to have a uterine fibroid.    She is very concerned because of her tamoxifen.  She is yet to have an [place].  Being evaluated by Dr. Tresa Res. 3.  Jehovah's witness.  HISTORY OF PRESENT ILLNESS: Chief Complaint  Patient presents with  . Breast Cancer Long Term Follow Up    Erika Parsons is a 51 y.o. (DOB: May 07, 1961)  white female who is a patient of ROMINE,CYNTHIA P, MD and comes to me today for follow up of right breast cancer.  She has done very well with her breast cancer with no areas of concern.  We spent most of the office visit talking about a uterine fibroid that was recently found by Dr. Tresa Res.  She is concerned about uterine cancer.  But she has had no vaginal bleeding.  And the fibroid has yet to be worked up.  She has stopped her tamoxifen.  We also talked about MRI or other breast imaging.  But I see no benefit to breast imaging in Ms. [place].  She is thin, has a good cosmetic reconstruction, and is easy to examine.  PHYSICAL EXAM: BP 110/72  Pulse 72  Temp 98.8 F (37.1 C) (Temporal)  Resp 16  Ht 5\' 4"  (1.626 m)  Wt 121 lb 2 oz (54.942 kg)  BMI 20.79 kg/m2  HEENT:  Pupils equal.  Dentition good. NECK:  Supple.  No thyroid mass. LYMPH NODES:  No cervical, supraclavicular, or  axillary adenopathy. BREASTS -  RIGHT:  Implant with nipple reconstruction.  No mass   LEFT:  Implant with nipple reconstruction.  No mass. UPPER EXTREMITIES:  No evidence of lymphedema.  DATA REVIEWED: None.   Ovidio Kin, MD, FACS Office:  [phone]

## 2012-05-23 ENCOUNTER — Telehealth: Payer: Self-pay | Admitting: *Deleted

## 2012-05-28 ENCOUNTER — Telehealth: Payer: Self-pay | Admitting: *Deleted

## 2012-05-28 NOTE — Telephone Encounter (Signed)
Pt called to state concern due to " gyn found I didn't have a uterine fibriod but an ovarian cyst "  She will have follow up in 3 months with gyn per above.  Erika Parsons inquired if she could have tumor marker for ovarian ca drawn with next lab appt in Aug.  This RN will inquire with MD per above inquiry.

## 2012-05-28 NOTE — Telephone Encounter (Signed)
This RN called pt per review with MD per concern for ovarian cyst and obtaining a CA 125/  Discussed with pt per MD concern of obtaining a test that can give false positives - need to discuss at next office visit.  Pt verbalized understanding.

## 2012-06-25 ENCOUNTER — Ambulatory Visit (HOSPITAL_BASED_OUTPATIENT_CLINIC_OR_DEPARTMENT_OTHER): Payer: PRIVATE HEALTH INSURANCE | Admitting: Physician Assistant

## 2012-06-25 ENCOUNTER — Telehealth: Payer: Self-pay | Admitting: *Deleted

## 2012-06-25 ENCOUNTER — Other Ambulatory Visit (HOSPITAL_BASED_OUTPATIENT_CLINIC_OR_DEPARTMENT_OTHER): Payer: PRIVATE HEALTH INSURANCE | Admitting: Lab

## 2012-06-25 ENCOUNTER — Encounter: Payer: Self-pay | Admitting: Physician Assistant

## 2012-06-25 VITALS — BP 132/86 | HR 73 | Temp 99.5°F | Resp 20 | Ht 64.0 in | Wt 123.5 lb

## 2012-06-25 DIAGNOSIS — C50419 Malignant neoplasm of upper-outer quadrant of unspecified female breast: Secondary | ICD-10-CM

## 2012-06-25 DIAGNOSIS — Z170 Estrogen receptor positive status [ER+]: Secondary | ICD-10-CM

## 2012-06-25 DIAGNOSIS — Z9010 Acquired absence of unspecified breast and nipple: Secondary | ICD-10-CM

## 2012-06-25 DIAGNOSIS — C50919 Malignant neoplasm of unspecified site of unspecified female breast: Secondary | ICD-10-CM

## 2012-06-25 LAB — CBC WITH DIFFERENTIAL/PLATELET
BASO%: 0.5 % (ref 0.0–2.0)
Eosinophils Absolute: 0.1 10*3/uL (ref 0.0–0.5)
LYMPH%: 31.9 % (ref 14.0–49.7)
MCHC: 35.1 g/dL (ref 31.5–36.0)
MCV: 95.5 fL (ref 79.5–101.0)
MONO#: 0.3 10*3/uL (ref 0.1–0.9)
MONO%: 6 % (ref 0.0–14.0)
NEUT#: 2.5 10*3/uL (ref 1.5–6.5)
RBC: 4.02 10*6/uL (ref 3.70–5.45)
RDW: 12.2 % (ref 11.2–14.5)
WBC: 4.3 10*3/uL (ref 3.9–10.3)

## 2012-06-25 NOTE — Telephone Encounter (Signed)
Made patient appointment for 09-2012 with the lab appointment being one week before the md appointment

## 2012-06-25 NOTE — Progress Notes (Signed)
ID: Erika Parsons   DOB: Dec 29, 1960  MR#: [id number]  JYN#:[id number]  HISTORY OF PRESENT ILLNESS: Erika Parsons had a screening mammogram early in April of this year, which really was unremarkable, but later that month she noted some thickening of the right breast, and Dr. Tresa Res sent her for diagnostic mammography and ultrasound on March 04, 2007.  The mammogram again saw a lot of calcifications, a lot of cysts, and it was just too dense to really read, but in the ultrasound there was a 2.6 cm area which was suspicious, and which was biopsied on the same day (March 04, 2007).  The pathologic reading ([id number] and [id number]), showed an intermediate grade carcinoma displaying prominent mucinous features.  It was strongly ER/PR positive at 91% and 99% respectively, with a low proliferation marker at 5%, and HER-2/neu negative at 0.    The patient was referred to Dr. Ezzard Standing, and bilateral breast MRIs were obtained on April 28.  This showed the mass in the right breast, upper-outer quadrant, to measure 3.5 cm.  Again, this is a very busy breast as is the left breast.  In the left breast there was also a 1.9 cm mass, which was separately biopsied on April 29, and showed [id number]) a fibroadenoma.    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 She declined full axillary dissection and radiation.  Oncotype DX recurrent score was 13. She is known to be BRCA1 and 2 negative. The patient started on tamoxifen in July 2008.   INTERVAL HISTORY: Erika Parsons returns today for routine one-year followup of her right breast carcinoma. Interval history is remarkable for Erika Parsons having been diagnosed with a cyst on her left ovary. She saw Dr. Tresa Res for routine followup in July, and at that time it appeared she had a "mass in her uterus". Since she had been on tamoxifen for complete 5 years as of July 2013, she  discontinued the tamoxifen at that time. A subsequent ultrasound showed that the mass was actually a 4 cm cyst on the ovary. This continues to be followed, and in fact she is scheduled for followup ultrasound in October. All this has been very worrisome to Erika Parsons.  Otherwise interval history is remarkable for Erika Parsons been very busy. She continues to work full-time. She exercises every day.  REVIEW OF SYSTEMS: Physically, Erika Parsons denies any recent illnesses. She's had no fevers or chills. Her hot flashes decreased significantly after she discontinued the tamoxifen in July. She is eating and drinking well denies nausea or change in bowel habits. No abdominal or pelvic pain. No cough, shortness of breath, or chest pain. No abnormal headaches or dizziness. No unusual myalgias or arthralgias. She also denies any abnormal bleeding, including vaginal bleeding.  A detailed review of systems is otherwise noncontributory.   PAST MEDICAL HISTORY: Past Medical History  Diagnosis Date  . Breast cancer 2008    bilateral mastectomy  . GERD (gastroesophageal reflux disease)     hiatal hernia    PAST SURGICAL HISTORY: Past Surgical History  Procedure Date  . Bilateral mastectomy 2008  . Adenoidectomy 1968  . Bilateral breast reconstruction 2008    FAMILY HISTORY Family History  Problem Relation Age of Onset  . Adopted: Yes    GYNECOLOGIC HISTORY: She is GX P0.  She is postmenopausal for >3 years.  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  Her husband, Erika Parsons, owns a Customer service manager.  Neither of them have any children. They do have two cats.    ADVANCED DIRECTIVES:  HEALTH MAINTENANCE: History  Substance Use Topics  . Smoking status: Never Smoker   . Smokeless tobacco: Never Used  . Alcohol Use: 2.4 oz/week    4 Glasses of wine per week     Colonoscopy: Never  PAP:  UTD, Dr. Tresa Res  Bone density: Never  Lipid  panel:  Allergies  Allergen Reactions  . Prednisone     hyperactivity    Current Outpatient Prescriptions  Medication Sig Dispense Refill  . Ascorbic Acid (VITAMIN C) 1000 MG tablet Take 1,000 mg by mouth daily.        . B Complex-Biotin-FA (B COMPLEX 100 TR PO) Take by mouth daily.        . Calcium Carbonate-Vitamin D (CALCIUM 600+D) 600-400 MG-UNIT per tablet Take 1 tablet by mouth daily.        . Cholecalciferol (VITAMIN D HIGH POTENCY) 1000 UNITS capsule Take 1,000 Units by mouth daily.        . fish oil-omega-3 fatty acids 1000 MG capsule Take 2 g by mouth daily. Takes 1200 mg daily       . Iodine, Kelp, (KELP PO) Take by mouth. Takes 250 mg daily       . Multiple Vitamin (MULTIVITAMIN) tablet Take 1 tablet by mouth daily.        Marland Kitchen triamcinolone (KENALOG) 0.1 % cream       . Zinc Sulfate (ZINC 15 PO) Take by mouth. Takes 75 mg daily       . ASTAXANTHIN PO Take by mouth. Takes 8 mg daily         OBJECTIVE: Middle-aged white female who appears comfortable and fit. Filed Vitals:   06/25/12 1328  BP: 132/86  Pulse: 73  Temp: 99.5 F (37.5 C)  Resp: 20     Body mass index is 21.20 kg/(m^2).    ECOG FS: 0 Filed Weights   06/25/12 1328  Weight: 123 lb 8 oz (56.019 kg)   Physical Exam: HEENT:  Sclerae anicteric.  Oropharynx clear.   Nodes:  No cervical, supraclavicular, or axillary lymphadenopathy palpated.  Breast Exam:  Patient status post bilateral mastectomies with bilateral reconstruction. There is no suspicious nodularity or skin change, and no evidence of local recurrence. Lungs:  Clear to auscultation bilaterally.  No crackles, rhonchi, or wheezes.   Heart:  Regular rate and rhythm.   Abdomen:  Soft, nontender.  Positive bowel sounds.  No organomegaly or masses palpated.   Musculoskeletal:  No focal spinal tenderness to palpation.  Extremities:  Benign.  No peripheral edema or cyanosis.   Skin:  Benign.   Neuro:  Nonfocal. Alert and oriented x3.    LAB  RESULTS: Lab Results  Component Value Date   WBC 4.3 06/25/2012   NEUTROABS 2.5 06/25/2012   HGB 13.5 06/25/2012   HCT 38.4 06/25/2012   MCV 95.5 06/25/2012   PLT 180 06/25/2012      Chemistry      Component Value Date/Time   NA 136 07/12/2011 1525   K 3.6 07/12/2011 1525   CL 98 07/12/2011 1525   CO2 28 07/12/2011 1525   BUN 10 07/12/2011 1525   CREATININE 0.99 07/12/2011 1525      Component Value Date/Time   CALCIUM 9.0 07/12/2011 1525   ALKPHOS 50 07/12/2011 1525   AST 25 07/12/2011 1525  ALT 19 07/12/2011 1525   BILITOT 0.2* 07/12/2011 1525      STUDIES: No results found.  ASSESSMENT: 51 y.o.  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  She declined full axillary dissection and radiation.   (2)  Her onco type Dx recurrent score was 13, predicting a 9% risk of distal recurrence if she took Tamoxifen for 5 years. Began on tamoxifen in July 2008, and continued until July 2013 with good tolerance.  (3)  She is known to be BRCA 1 and 2 negative.    PLAN: With regards to her breast cancer, Erika Parsons is doing quite well, and has a great prognosis. The case was reviewed with Dr. Darnelle Parsons who also spoke extensively with the patient today. He is very comfortable at this point with Erika Parsons discontinuing her antiestrogen therapy, so we will not continue tamoxifen, or switch to an aromatase inhibitor at this time.  As noted above, Erika Parsons is scheduled for repeat ultrasound in October to evaluate the ovarian cyst. She will return here sooner after in November for repeat labs and physical exam with Dr. Darnelle Parsons. If all is well, she may likely "graduate" from followup here at that time. Keyani voices understanding and agreement  with this plan, and will call with any changes or problems prior to her next scheduled appointment.    Maude Hettich    06/25/2012

## 2012-06-26 LAB — COMPREHENSIVE METABOLIC PANEL
ALT: 53 U/L — ABNORMAL HIGH (ref 0–35)
BUN: 9 mg/dL (ref 6–23)
CO2: 30 mEq/L (ref 19–32)
Calcium: 9.1 mg/dL (ref 8.4–10.5)
Chloride: 100 mEq/L (ref 96–112)
Creatinine, Ser: 0.88 mg/dL (ref 0.50–1.10)
Total Bilirubin: 0.4 mg/dL (ref 0.3–1.2)

## 2012-06-26 LAB — VITAMIN D 25 HYDROXY (VIT D DEFICIENCY, FRACTURES): Vit D, 25-Hydroxy: 49 ng/mL (ref 30–89)

## 2012-06-26 LAB — CANCER ANTIGEN 27.29: CA 27.29: 19 U/mL (ref 0–39)

## 2012-07-15 DIAGNOSIS — N8501 Benign endometrial hyperplasia: Secondary | ICD-10-CM

## 2012-07-15 HISTORY — DX: Benign endometrial hyperplasia: N85.01

## 2012-09-24 ENCOUNTER — Other Ambulatory Visit (HOSPITAL_BASED_OUTPATIENT_CLINIC_OR_DEPARTMENT_OTHER): Payer: PRIVATE HEALTH INSURANCE | Admitting: Lab

## 2012-09-24 DIAGNOSIS — C50919 Malignant neoplasm of unspecified site of unspecified female breast: Secondary | ICD-10-CM

## 2012-09-24 LAB — CBC WITH DIFFERENTIAL/PLATELET
Basophils Absolute: 0 10*3/uL (ref 0.0–0.1)
Eosinophils Absolute: 0.1 10*3/uL (ref 0.0–0.5)
HGB: 13.1 g/dL (ref 11.6–15.9)
MONO#: 0.3 10*3/uL (ref 0.1–0.9)
MONO%: 6.8 % (ref 0.0–14.0)
NEUT#: 2.5 10*3/uL (ref 1.5–6.5)
RBC: 4.04 10*6/uL (ref 3.70–5.45)
RDW: 12.4 % (ref 11.2–14.5)
WBC: 4.9 10*3/uL (ref 3.9–10.3)
lymph#: 1.9 10*3/uL (ref 0.9–3.3)
nRBC: 0 % (ref 0–0)

## 2012-09-24 LAB — COMPREHENSIVE METABOLIC PANEL (CC13)
ALT: 43 U/L (ref 0–55)
AST: 39 U/L — ABNORMAL HIGH (ref 5–34)
CO2: 29 mEq/L (ref 22–29)
Calcium: 9.2 mg/dL (ref 8.4–10.4)
Chloride: 101 mEq/L (ref 98–107)
Creatinine: 1.1 mg/dL (ref 0.6–1.1)
Potassium: 3.8 mEq/L (ref 3.5–5.1)
Sodium: 136 mEq/L (ref 136–145)
Total Protein: 6.6 g/dL (ref 6.4–8.3)

## 2012-09-24 LAB — CANCER ANTIGEN 27.29: CA 27.29: 24 U/mL (ref 0–39)

## 2012-10-01 ENCOUNTER — Ambulatory Visit (HOSPITAL_BASED_OUTPATIENT_CLINIC_OR_DEPARTMENT_OTHER): Payer: PRIVATE HEALTH INSURANCE | Admitting: Oncology

## 2012-10-01 VITALS — BP 106/70 | HR 74 | Temp 98.3°F | Resp 20 | Ht 64.0 in | Wt 125.8 lb

## 2012-10-01 DIAGNOSIS — C50919 Malignant neoplasm of unspecified site of unspecified female breast: Secondary | ICD-10-CM

## 2012-10-01 DIAGNOSIS — C50419 Malignant neoplasm of upper-outer quadrant of unspecified female breast: Secondary | ICD-10-CM

## 2012-10-01 DIAGNOSIS — Z170 Estrogen receptor positive status [ER+]: Secondary | ICD-10-CM

## 2012-10-01 NOTE — Progress Notes (Signed)
ID: Erika Parsons   DOB: 03-08-1961  MR#: [id number]  HYQ#:[id number]  PCP: Alison Murray, MD GYN:  SUOvidio Kin MD OTHER MD:   HISTORY OF PRESENT ILLNESS: She had a screening mammogram early in April of this year, which really was unremarkable, but later that month she noted some thickening of the right breast, and Dr. Tresa Res sent her for diagnostic mammography and ultrasound on March 04, 2007.  The mammogram again saw a lot of calcifications, a lot of cysts, and it was just too dense to really read, but in the ultrasound there was a 2.6 cm area which was suspicious, and which was biopsied on the same day (March 04, 2007).  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  It was strongly ER/PR positive at 91% and 99% respectively, with a low proliferation marker at 5%, and HER-2/neu negative at 0.    The patient was referred to Dr. Ezzard Standing, and bilateral breast MRIs were obtained on April 28.  This showed the mass in the right breast, upper-outer quadrant, to measure 3.5 cm.  Again, this is a very busy breast as is the left breast.  In the left breast there was also a 1.9 cm mass, which was separately biopsied on April 29, and showed [id number]) a fibroadenoma. Her subsequent history is as detailed below.  INTERVAL HISTORY: Lenni returns today for followup of her breast cancer. Since her last visit here she had significant problems with postmenopausal bleeding. This was aggressively worked up and treated by Dr. Tresa Res, including a short course of progesterone which caused appropriate bleeding. That problem has now resolved.  REVIEW OF SYSTEMS: Lynae is exercising regularly, including jogging. She stopped her tamoxifen June of this year. She had tolerated well, aside from the bleeding problem just described. A detailed review of systems today is otherwise entirely negative.  PAST MEDICAL HISTORY: Past Medical  History  Diagnosis Date  . Breast cancer 2008    bilateral mastectomy  . GERD (gastroesophageal reflux disease)     hiatal hernia   PAST SURGICAL HISTORY: Past Surgical History  Procedure Date  . Bilateral mastectomy 2008  . Adenoidectomy 1968  . Bilateral breast reconstruction 2008    FAMILY HISTORY Family History  Problem Relation Age of Onset  . Adopted: Yes  The patient is adopted, and knows nothing about her biological family.   GYNECOLOGIC HISTORY: She is GX P0.  She had her most recent period last week.She stopped having periods with chemotherapy.  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  Her husband, Gabriel Rung, owns a Customer service manager.  Neither of them have any children.     ADVANCED DIRECTIVES: Her husband is her healthcare power of attorney. In case of life-threatening bleeding, Nylene would still refuse a transfusion.  HEALTH MAINTENANCE: History  Substance Use Topics  . Smoking status: Never Smoker   . Smokeless tobacco: Never Used  . Alcohol Use: 2.4 oz/week    4 Glasses of wine per week     Colonoscopy:  PAP:  Bone density:  Lipid panel:  Allergies  Allergen Reactions  . Prednisone     hyperactivity    Current Outpatient Prescriptions  Medication Sig Dispense Refill  . Ascorbic Acid (VITAMIN C) 1000 MG tablet Take 1,000 mg by mouth daily.        . B Complex-Biotin-FA (B COMPLEX 100 TR PO) Take by mouth  daily.        . Calcium Carbonate-Vitamin D (CALCIUM 600+D) 600-400 MG-UNIT per tablet Take 1 tablet by mouth daily.        . Cholecalciferol (VITAMIN D HIGH POTENCY) 1000 UNITS capsule Take 1,000 Units by mouth daily.        . fish oil-omega-3 fatty acids 1000 MG capsule Take 2 g by mouth daily. Takes 1200 mg daily       . Iodine, Kelp, (KELP PO) Take by mouth. Takes 250 mg daily       . Multiple Vitamin (MULTIVITAMIN) tablet Take 1 tablet by mouth daily.        . Zinc  Sulfate (ZINC 15 PO) Take by mouth. Takes 75 mg daily       . triamcinolone (KENALOG) 0.1 % cream         OBJECTIVE: Middle-aged white woman who appears well. Filed Vitals:   10/01/12 1403  BP: 106/70  Pulse: 74  Temp: 98.3 F (36.8 C)  Resp: 20     Body mass index is 21.59 kg/(m^2).    ECOG FS: 0  Sclerae unicteric Oropharynx clear No cervical or supraclavicular adenopathy Lungs no rales or rhonchi Heart regular rate and rhythm Abd benign MSK no focal spinal tenderness, no peripheral edema Neuro: nonfocal Breasts: She status post bilateral mastectomies with implant placement. There is no evidence of local recurrence.   LAB RESULTS: Lab Results  Component Value Date   WBC 4.9 09/24/2012   NEUTROABS 2.5 09/24/2012   HGB 13.1 09/24/2012   HCT 37.3 09/24/2012   MCV 92.3 09/24/2012   PLT 180 09/24/2012      Chemistry      Component Value Date/Time   NA 136 09/24/2012 1610   NA 138 06/25/2012 1312   K 3.8 09/24/2012 1610   K 4.1 06/25/2012 1312   CL 101 09/24/2012 1610   CL 100 06/25/2012 1312   CO2 29 09/24/2012 1610   CO2 30 06/25/2012 1312   BUN 16.0 09/24/2012 1610   BUN 9 06/25/2012 1312   CREATININE 1.1 09/24/2012 1610   CREATININE 0.88 06/25/2012 1312      Component Value Date/Time   CALCIUM 9.2 09/24/2012 1610   CALCIUM 9.1 06/25/2012 1312   ALKPHOS 61 09/24/2012 1610   ALKPHOS 49 06/25/2012 1312   AST 39* 09/24/2012 1610   AST 47* 06/25/2012 1312   ALT 43 09/24/2012 1610   ALT 53* 06/25/2012 1312   BILITOT 0.28 09/24/2012 1610   BILITOT 0.4 06/25/2012 1312       Lab Results  Component Value Date   LABCA2 24 09/24/2012    No components found with this basename: LABCA125    No results found for this basename: INR:1;PROTIME:1 in the last 168 hours  Urinalysis No results found for this basename: colorurine, appearanceur, labspec, phurine, glucoseu, hgbur, bilirubinur, ketonesur, proteinur, urobilinogen, nitrite, leukocytesur    STUDIES: No  results found.  ASSESSMENT: 51 y.o. 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    (2) She refused full axillary dissection and radiation.   (3) Her oncotype Dx recurrent score was 13, predicting a 9% risk of distal recurrence if she took Tamoxifen for 5 years.     (2) She  took Tamoxifen July of 2008 to June 2013  PLAN: Today we discussed the possibility of taking aromatase inhibitors for 5 years. This would likely drop her risk of recurrence of from the current 9% to about 5%. This means 96% of women like her would not benefits but on the other hand 4% would. We discussed the possible side effects toxicities and complications of aromatase inhibitors. After much discussion she decided not to take these. She is very comfortable with her current excellent prognosis. She just "wants to get on with her life".  Accordingly I have not made any further appointments for her here. She will followup with Dr. Ezzard Standing and Dr. Tresa Res at their discretion. Of course we will be glad to see Lisaanne again at any point if the need arises.     Lowella Dell    10/01/2012  [id number] [id number]

## 2012-10-02 ENCOUNTER — Telehealth: Payer: Self-pay | Admitting: Oncology

## 2012-10-02 NOTE — Telephone Encounter (Signed)
Letter fax to Dr. Ovidio Kin from Dr.Magrinat

## 2012-10-02 NOTE — Telephone Encounter (Signed)
Letter sent to Dr.Cynthia Romine from Dr.Magrinat

## 2013-04-24 ENCOUNTER — Ambulatory Visit: Payer: Self-pay | Admitting: Obstetrics and Gynecology

## 2013-04-24 ENCOUNTER — Ambulatory Visit: Payer: Self-pay | Admitting: Gynecology

## 2013-05-20 ENCOUNTER — Ambulatory Visit: Payer: Self-pay | Admitting: Obstetrics and Gynecology

## 2013-06-02 ENCOUNTER — Encounter: Payer: Self-pay | Admitting: Obstetrics and Gynecology

## 2013-06-03 ENCOUNTER — Encounter: Payer: Self-pay | Admitting: Obstetrics and Gynecology

## 2013-06-03 ENCOUNTER — Ambulatory Visit (INDEPENDENT_AMBULATORY_CARE_PROVIDER_SITE_OTHER): Payer: Managed Care, Other (non HMO) | Admitting: Obstetrics and Gynecology

## 2013-06-03 VITALS — BP 110/70 | HR 82 | Resp 20 | Ht 64.0 in | Wt 118.0 lb

## 2013-06-03 DIAGNOSIS — Z0000 Encounter for general adult medical examination without abnormal findings: Secondary | ICD-10-CM

## 2013-06-03 DIAGNOSIS — Z01419 Encounter for gynecological examination (general) (routine) without abnormal findings: Secondary | ICD-10-CM

## 2013-06-03 LAB — POCT URINALYSIS DIPSTICK
Glucose, UA: NEGATIVE
Leukocytes, UA: NEGATIVE
Nitrite, UA: NEGATIVE
Urobilinogen, UA: NEGATIVE

## 2013-06-03 LAB — HEMOGLOBIN, FINGERSTICK: Hemoglobin, fingerstick: 13.2 g/dL (ref 12.0–16.0)

## 2013-06-03 NOTE — Patient Instructions (Signed)

## 2013-06-03 NOTE — Progress Notes (Signed)
52 y.o.   Married    Caucasian   female   G0P0000   here for annual exam.  No vag bleeding at all this year.  Pt had vag bleeding in July 2013 right after she stopped her tamoxifen in June 2013.  Endo bx then showed simple hyperplasia.  Pt was treated with provera 10 mg for 10 days and had a 2 week w/d bleed.  She has had no bleeding at all since then.  FSH in Nov 2013 was 93.9.    Patient's last menstrual period was 10/12/2009.          Sexually active: yes  The current method of family planning is post menopausal status.    Exercising: aerobics, cardio 3-4 days a week, also running Last mammogram:  2008 Bilat Masectomy Last pap smear:03/01/10 neg History of abnormal pap: no Smoking: never Alcohol: 3-4 drinks a week (glass of wine or a bloody mary) Last colonoscopy: 06/2011 normal, repeat in 10 years Last Bone Density:  never Last tetanus shot: less than 10 years Last cholesterol check: 01/2013  Total 206, hdl 93, ldl 100, trig 65  Hgb:   13.2             Urine: neg   Family History  Problem Relation Age of Onset  . Adopted: Yes    Patient Active Problem List   Diagnosis Date Noted  . Breast cancer, Right. T2, N82mic. Mastectomy 04/23/2007. 05/02/2012  . Uterine mass 05/02/2012    Past Medical History  Diagnosis Date  . Breast cancer 2008    bilateral mastectomy  . GERD (gastroesophageal reflux disease)     hiatal hernia  . 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 Breast cancer 02/2007    Right breast 2.8 cm + micromets in sentinel node  . Simple endometrial hyperplasia 07/15/12    on bx 2013, done for PMB (with three months with tamox)    Past Surgical History  Procedure Laterality Date  . Bilateral mastectomy  04/2007    reconstruction, tamoxifen no SSRI while on it no rt or chemo. Started tamoxifen 7/08- quite June 2013  . Adenoidectomy  1968  . Bilateral breast reconstruction  2008    Allergies: Prednisone  Current Outpatient Prescriptions  Medication Sig  Dispense Refill  . Ascorbic Acid (VITAMIN C) 1000 MG tablet Take 1,000 mg by mouth daily.        . B Complex-Biotin-FA (B COMPLEX 100 TR PO) Take by mouth daily.        . calcium carbonate (TUMS - DOSED IN MG ELEMENTAL CALCIUM) 500 MG chewable tablet Chew 1 tablet by mouth daily.      . Calcium Carbonate-Vitamin D (CALCIUM 600+D) 600-400 MG-UNIT per tablet Take 1 tablet by mouth daily.        . Cholecalciferol (VITAMIN D HIGH POTENCY) 1000 UNITS capsule Take 1,000 Units by mouth daily.        . fish oil-omega-3 fatty acids 1000 MG capsule Take 2 g by mouth daily. Takes 1200 mg daily       . Iodine, Kelp, (KELP PO) Take by mouth. Takes 250 mg daily       . Multiple Vitamin (MULTIVITAMIN) tablet Take 1 tablet by mouth daily.        Marland Kitchen triamcinolone (KENALOG) 0.1 % cream as needed.       . Zinc Sulfate (ZINC 15 PO) Take by mouth. Takes 75 mg daily  No current facility-administered medications for this visit.    ROS: Pertinent items are noted in HPI.  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   Wt Readings from Last 3 Encounters:  06/03/13 118 lb (53.524 kg)  10/01/12 125 lb 12.8 oz (57.063 kg)  06/25/12 123 lb 8 oz (56.019 kg)     Ht Readings from Last 3 Encounters:  06/03/13 5\' 4"  (1.626 m)  10/01/12 5\' 4"  (1.626 m)  06/25/12 5\' 4"  (1.626 m)    General appearance: alert, cooperative and appears stated age Head: Normocephalic, without obvious abnormality, atraumatic Neck: no adenopathy, supple, symmetrical, trachea midline and thyroid not enlarged, symmetric, no tenderness/mass/nodules Lungs: clear to auscultation bilaterally Breasts:bilat mastectomies with reconstruction.  No masses.  No adenopathy Heart: regular rate and rhythm Abdomen: soft, non-tender; bowel sounds  normal; no masses,  no organomegaly Extremities: extremities normal, atraumatic, no cyanosis or edema Skin: Skin color, texture, turgor normal. No rashes or lesions Lymph nodes: Cervical, supraclavicular, and axillary nodes normal. No abnormal inguinal nodes palpated Neurologic: Grossly normal   Pelvic: External genitalia:  no lesions              Urethra:  normal appearing urethra with no masses, tenderness or lesions              Bartholins and Skenes: normal                 Vagina: normal appearing vagina with normal color and discharge, no lesions              Cervix: normal appearance              Pap taken: yes        Bimanual Exam:  Uterus:  uterus is normal size, shape, consistency and nontender                                      Adnexa: normal adnexa in size, nontender and no masses                                      Rectovaginal: Confirms                                      Anus:  normal sphincter tone, no lesions  A: normal menopausal exam, no HRT     Right breast cancer 2.8 cm with micromets in sentinel node Oct 2008.  Bilat mastectomies with reconstruction, then tamox until June 2013.       P:    pap smear counseled on breast self exam, adequate intake of calcium and vitamin D, diet and exercise return annually and prn     An After Visit Summary was printed and given to the patient.

## 2013-06-08 LAB — IPS PAP TEST WITH HPV

## 2013-06-23 ENCOUNTER — Telehealth: Payer: Self-pay | Admitting: Obstetrics and Gynecology

## 2013-06-23 NOTE — Telephone Encounter (Signed)
Patient calling with information since her last OV , July 23rd Dr, Tresa Res. suggestion use of Olive Oil as a lubricant. Patient states she did use. 2 days after she got a UTI. 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 Calling today of symptoms of Yeast Infection from antibiotic . Went to store last nite and got Monistat 3 to use and today is better and is helping. Patient instructed also on [employer] use. Let air to area as much as possible. Wearing of cotton panties and loose clothing. Patient stated she was also eating yogurt. Stated again she was better this morning. Asked if she needed any oral Rx medication .  Will let patient know if any Rx medication needed.  Please advise.

## 2013-06-23 NOTE — Telephone Encounter (Signed)
Patient notified of no need for Rx medications at this time. Patient is to continue OTC meds. Patient aware of Dr. Tresa Res instructions regarding urination after any SA. Patient to call back if no improvement.

## 2013-06-23 NOTE — Telephone Encounter (Signed)
Patient calling for medical advice about a yeast infection she thinks she has. Patient declined appointment as she is taking over the counter medication to treat symptoms. Just calling for advice.

## 2013-06-23 NOTE — Telephone Encounter (Signed)
No, she has done the right things.  Good job.  Just by the way, it wasn't the olive oil that gave her a bladder infection, it was the sex.  Be sure and get up and urinate after sex every sing time no matter what, because that helps prevent bladder infections associated with sex.

## 2013-06-25 ENCOUNTER — Encounter (INDEPENDENT_AMBULATORY_CARE_PROVIDER_SITE_OTHER): Payer: Self-pay | Admitting: Surgery

## 2013-06-25 ENCOUNTER — Ambulatory Visit (INDEPENDENT_AMBULATORY_CARE_PROVIDER_SITE_OTHER): Payer: PRIVATE HEALTH INSURANCE | Admitting: Surgery

## 2013-06-25 VITALS — BP 138/84 | HR 64 | Temp 98.6°F | Resp 12 | Ht 64.0 in | Wt 118.8 lb

## 2013-06-25 DIAGNOSIS — C50911 Malignant neoplasm of unspecified site of right female breast: Secondary | ICD-10-CM

## 2013-06-25 DIAGNOSIS — C50919 Malignant neoplasm of unspecified site of unspecified female breast: Secondary | ICD-10-CM

## 2013-06-25 NOTE — Progress Notes (Signed)
[facility]  Ovidio Kin, MD,  FACS [address] [place]  [address] [place], [place]    [address] Phone:  [phone] FAX:  [phone]   Re:   Erika Parsons DOB:   21-Jun-1961 MRN:   [id number]  ASSESSMENT AND PLAN: 1.  Right breast cancer.  T2, N42mic  2.8 cm IDC, grade 1/3.  108mic/2 nodes.   ER - 91%, PR - 99%, Her2Neu - neg, Ki67 - 5%.  Bilateral mastectomies - 04/23/2007  Bilateral reconstruction by Dr. Leandrew Koyanagi - 04/23/2007  Oncotype DX - 13.  Stopped tamoxifen in 2013 (at about 5 years)  Saw Dr. Darnelle Catalan who has now released her.  NED.  I will follow her for 10 years.  To see me back in one year.  2.  Found to have a uterine fibroid.    She is very concerned because of her tamoxifen.  She is yet to have an [place].  Being evaluated by Dr. Tresa Res. 3.  Jehovah's witness.  HISTORY OF PRESENT ILLNESS: Chief Complaint  Patient presents with  . Breast Cancer Long Term Follow Up    Erika Parsons is a 52 y.o. (DOB: 12/24/1960)  white female who is a patient of No PCP Per Patient and comes to me today for follow up of right breast cancer.  She has done very well with her breast cancer with no areas of concern. She had some uterine fibroids, but took a single progestin, and this has gotten better. She talked to Dr. Darnelle Catalan about continuing an aromatase inhibitor for 5 years, but decided against that. She has no new area of concern.  Social History:  PHYSICAL EXAM: BP 138/84  Pulse 64  Temp(Src) 98.6 F (37 C) (Oral)  Resp 12  Ht 5\' 4"  (1.626 m)  Wt 118 lb 12.8 oz (53.887 kg)  BMI 20.38 kg/m2  LMP 10/12/2009  HEENT:  Pupils equal.  Dentition good. NECK:  Supple.  No thyroid mass. LYMPH NODES:  No cervical, supraclavicular, or axillary adenopathy. BREASTS -  RIGHT:  Implant with nipple reconstruction.  No mass   LEFT:  Implant with nipple reconstruction.  No mass. UPPER EXTREMITIES:  No evidence of lymphedema.  DATA REVIEWED: Epic  notes.  Ovidio Kin, MD, FACS Office:  [phone]

## 2013-09-17 ENCOUNTER — Other Ambulatory Visit: Payer: Self-pay

## 2014-06-03 ENCOUNTER — Telehealth: Payer: Self-pay | Admitting: Obstetrics and Gynecology

## 2014-06-03 NOTE — Telephone Encounter (Signed)
Confirming pts appt °

## 2014-06-10 ENCOUNTER — Encounter: Payer: Self-pay | Admitting: Obstetrics and Gynecology

## 2014-06-10 ENCOUNTER — Ambulatory Visit (INDEPENDENT_AMBULATORY_CARE_PROVIDER_SITE_OTHER): Payer: Managed Care, Other (non HMO) | Admitting: Obstetrics and Gynecology

## 2014-06-10 VITALS — BP 100/80 | HR 80 | Resp 20 | Ht 64.0 in | Wt 121.0 lb

## 2014-06-10 DIAGNOSIS — Z01419 Encounter for gynecological examination (general) (routine) without abnormal findings: Secondary | ICD-10-CM

## 2014-06-10 DIAGNOSIS — Z0000 Encounter for general adult medical examination without abnormal findings: Secondary | ICD-10-CM

## 2014-06-10 LAB — POCT URINALYSIS DIPSTICK
BILIRUBIN UA: NEGATIVE
Blood, UA: NEGATIVE
GLUCOSE UA: NEGATIVE
KETONES UA: NEGATIVE
LEUKOCYTES UA: NEGATIVE
Nitrite, UA: NEGATIVE
PROTEIN UA: NEGATIVE
Urobilinogen, UA: NEGATIVE
pH, UA: 8

## 2014-06-10 LAB — CBC
HEMATOCRIT: 41.1 % (ref 36.0–46.0)
Hemoglobin: 14.1 g/dL (ref 12.0–15.0)
MCH: 32.2 pg (ref 26.0–34.0)
MCHC: 34.3 g/dL (ref 30.0–36.0)
MCV: 93.8 fL (ref 78.0–100.0)
Platelets: 236 10*3/uL (ref 150–400)
RBC: 4.38 MIL/uL (ref 3.87–5.11)
RDW: 13.2 % (ref 11.5–15.5)
WBC: 4.3 10*3/uL (ref 4.0–10.5)

## 2014-06-10 LAB — HEMOGLOBIN, FINGERSTICK: HEMOGLOBIN, FINGERSTICK: 13.7 g/dL (ref 12.0–16.0)

## 2014-06-10 NOTE — Patient Instructions (Signed)

## 2014-06-10 NOTE — Progress Notes (Signed)
GYNECOLOGY VISIT  PCP: No PCP  Referring provider:   HPI: 53 y.o.   Married  Caucasian  female   Erika Parsons with Patient's last menstrual period was 10/12/2009.   here for   Annual Gynecological Examination   Status post bilateral mastectomy.  Had genetic testing and BRCA testing was negative.   Status post tamoxifen from July 2008 - June 2013.  Had postmenopausal bleeding while on tamoxifen.  EMB 2013 - simple endometrial hyperplasia without atypia.  Treated with a course of Provera therapy.  Pelvic ultrasound 2013 showed EMB 6 mm.  Myometrium normal.  Right ovary normal.  Left ovary with 15 x 15 mm avascular follicular cyst with low level echoes.   Hgb:  13.7 Urine:  Neg  GYNECOLOGIC HISTORY: Patient's last menstrual period was 10/12/2009. Sexually active: yes  Partner preference: Female  Contraception:   Post- Menopausal  Menopausal hormone therapy: no DES exposure: No Blood transfusions:  No  Sexually transmitted diseases: ASCUS Pap 2009 GYN procedures and prior surgeries:  No Last mammogram:   MR Breast 2008 [id number]   - Bilateral mastectomy in 2008 with reconstruction - Dr. Lucia Gaskins and Dr. Harlow Mares.     Last pap and high risk HPV testing:   05/2013 - WNL, negative HR HPV.  History of abnormal pap smear:  Yes.  ASCUS pap in 2009 and no prior treatment.    OB History   Grav Para Term Preterm Abortions TAB SAB Ect Mult Living   0 0 0 0 0 0 0 0 0 0        LIFESTYLE: Exercise:  Aerobics, strength training and walking 3 x weekly             Tobacco: No Alcohol: Yes 4 glasses of wine weekly  Drug use:  No  OTHER HEALTH MAINTENANCE: Tetanus/TDap: 2008 Gardisil: No Influenza:  No Zostavax: No  Bone density: 03/2007 Colonoscopy: 06/2011 - Dr. Fuller Plan, next due in 2022.   Cholesterol check: 01/2013   Family History  Problem Relation Age of Onset  . Adopted: Yes    Patient Active Problem List   Diagnosis Date Noted  . Breast cancer, Right. T2, N75mc. Mastectomy 04/23/2007.  05/02/2012  . Uterine mass 05/02/2012   Past Medical History  Diagnosis Date  . Breast cancer 2008    bilateral mastectomy  . GERD (gastroesophageal reflux disease)     hiatal hernia  . 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 Breast cancer 02/2007    Right breast 2.8 cm + micromets in sentinel node  . Simple endometrial hyperplasia 07/15/12    on bx 2013, done for PMB (with three months with tamox)    Past Surgical History  Procedure Laterality Date  . Bilateral mastectomy  04/2007    reconstruction, tamoxifen no SSRI while on it no rt or chemo. Started tamoxifen 7/08- quite June 2013  . Adenoidectomy  1968  . Bilateral breast reconstruction  2008    ALLERGIES: Prednisone  Current Outpatient Prescriptions  Medication Sig Dispense Refill  . Ascorbic Acid (VITAMIN C) 1000 MG tablet Take 1,000 mg by mouth daily.        . B Complex-Biotin-FA (B COMPLEX 100 TR PO) Take by mouth daily.        . calcium carbonate (TUMS - DOSED IN MG ELEMENTAL CALCIUM) 500 MG chewable tablet Chew 1 tablet by mouth daily.      . Calcium Carbonate-Vitamin D (CALCIUM 600+D) 600-400 MG-UNIT per tablet Take 1 tablet  by mouth daily.        . Cholecalciferol (VITAMIN D HIGH POTENCY) 1000 UNITS capsule Take 1,000 Units by mouth daily.        . fish oil-omega-3 fatty acids 1000 MG capsule Take 2 g by mouth daily. Takes 1200 mg daily       . Iodine, Kelp, (KELP PO) Take by mouth. Takes 250 mg daily       . Lysine 500 MG TABS Take by mouth.      . Multiple Vitamin (MULTIVITAMIN) tablet Take 1 tablet by mouth daily.        . Zinc 50 MG CAPS Take by mouth.      . Zinc Sulfate (ZINC 15 PO) Take by mouth. Takes 75 mg daily       . triamcinolone (KENALOG) 0.1 % cream as needed.        No current facility-administered medications for this visit.     ROS:  Pertinent items are noted in HPI.  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   PHYSICAL EXAMINATION:    BP 100/80   Pulse 80  Resp 20  Ht 5' 4"  (1.626 m)  Wt 121 lb (54.885 kg)  BMI 20.76 kg/m2  LMP 10/12/2009   Wt Readings from Last 3 Encounters:  06/10/14 121 lb (54.885 kg)  06/25/13 118 lb 12.8 oz (53.887 kg)  06/03/13 118 lb (53.524 kg)     Ht Readings from Last 3 Encounters:  06/10/14 5' 4"  (1.626 m)  06/25/13 5' 4"  (1.626 m)  06/03/13 5' 4"  (1.626 m)    General appearance: alert, cooperative and appears stated age Head: Normocephalic, without obvious abnormality, atraumatic Neck: no adenopathy, supple, symmetrical, trachea midline and thyroid not enlarged, symmetric, no tenderness/mass/nodules Lungs: clear to auscultation bilaterally Breasts: Absent. Reconstruction done.  Heart: regular rate and rhythm Abdomen: soft, non-tender; no masses,  no organomegaly Extremities: extremities normal, atraumatic, no cyanosis or edema Skin: Skin color, texture, turgor normal. No rashes or lesions Lymph nodes: Cervical, supraclavicular, and axillary nodes normal. No abnormal inguinal nodes palpated Neurologic: Grossly normal  Pelvic: External genitalia:  no lesions              Urethra:  normal appearing urethra with no masses, tenderness or lesions              Bartholins and Skenes: normal                 Vagina: normal appearing vagina with normal color and discharge, no lesions              Cervix: normal appearance              Pap and high risk HPV testing done: No..            Bimanual Exam:  Uterus:  uterus is normal size, shape, consistency and nontender                                      Adnexa: normal adnexa in size, nontender and no masses                                      Rectovaginal: Confirms  Anus:  normal sphincter tone, no lesions  ASSESSMENT  Normal gynecologic exam. Status post bilateral mastectomies with reconstruction for breast cancer.  BRCA negative.  Status post Tamoxifen treatment.   PLAN  Pap smear and high risk HPV  testing - will do every other year.  Counseled on self breast exam, Calcium and vitamin D intake, exercise. Check lipid profile, CMP, CBC. Return annually or prn   An After Visit Summary was printed and given to the patient.

## 2014-06-11 LAB — COMPREHENSIVE METABOLIC PANEL
ALT: 19 U/L (ref 0–35)
AST: 23 U/L (ref 0–37)
Albumin: 4.6 g/dL (ref 3.5–5.2)
Alkaline Phosphatase: 72 U/L (ref 39–117)
BILIRUBIN TOTAL: 0.5 mg/dL (ref 0.2–1.2)
BUN: 11 mg/dL (ref 6–23)
CALCIUM: 9.5 mg/dL (ref 8.4–10.5)
CHLORIDE: 96 meq/L (ref 96–112)
CO2: 30 mEq/L (ref 19–32)
CREATININE: 0.78 mg/dL (ref 0.50–1.10)
Glucose, Bld: 92 mg/dL (ref 70–99)
Potassium: 3.8 mEq/L (ref 3.5–5.3)
Sodium: 134 mEq/L — ABNORMAL LOW (ref 135–145)
Total Protein: 7 g/dL (ref 6.0–8.3)

## 2014-06-11 LAB — LIPID PANEL
CHOL/HDL RATIO: 2 ratio
CHOLESTEROL: 185 mg/dL (ref 0–200)
HDL: 91 mg/dL (ref >39)
LDL Cholesterol: 80 mg/dL (ref 0–99)
TRIGLYCERIDES: 68 mg/dL (ref <150)
VLDL: 14 mg/dL (ref 0–40)

## 2014-09-29 ENCOUNTER — Telehealth: Payer: Self-pay | Admitting: Obstetrics and Gynecology

## 2014-09-29 NOTE — Telephone Encounter (Signed)
Left message upcoming appointment time has changed from 2:30 to 1:30.

## 2015-05-17 DIAGNOSIS — Z0289 Encounter for other administrative examinations: Secondary | ICD-10-CM

## 2015-05-30 ENCOUNTER — Encounter: Payer: Self-pay | Admitting: Genetic Counselor

## 2015-06-15 ENCOUNTER — Ambulatory Visit: Payer: Managed Care, Other (non HMO) | Admitting: Obstetrics and Gynecology

## 2015-06-22 ENCOUNTER — Encounter: Payer: Self-pay | Admitting: Obstetrics and Gynecology

## 2015-06-22 ENCOUNTER — Ambulatory Visit (INDEPENDENT_AMBULATORY_CARE_PROVIDER_SITE_OTHER): Payer: 59 | Admitting: Obstetrics and Gynecology

## 2015-06-22 VITALS — HR 64 | Resp 16 | Ht 64.0 in | Wt 127.0 lb

## 2015-06-22 DIAGNOSIS — Z01419 Encounter for gynecological examination (general) (routine) without abnormal findings: Secondary | ICD-10-CM

## 2015-06-22 DIAGNOSIS — Z0000 Encounter for general adult medical examination without abnormal findings: Secondary | ICD-10-CM | POA: Diagnosis not present

## 2015-06-22 LAB — COMPREHENSIVE METABOLIC PANEL
ALT: 17 U/L (ref 6–29)
AST: 22 U/L (ref 10–35)
Albumin: 4.3 g/dL (ref 3.6–5.1)
Alkaline Phosphatase: 48 U/L (ref 33–130)
BUN: 12 mg/dL (ref 7–25)
CALCIUM: 9.6 mg/dL (ref 8.6–10.4)
CHLORIDE: 99 mmol/L (ref 98–110)
CO2: 29 mmol/L (ref 20–31)
Creat: 0.78 mg/dL (ref 0.50–1.05)
Glucose, Bld: 72 mg/dL (ref 65–99)
Potassium: 4.1 mmol/L (ref 3.5–5.3)
Sodium: 135 mmol/L (ref 135–146)
Total Bilirubin: 0.5 mg/dL (ref 0.2–1.2)
Total Protein: 6.4 g/dL (ref 6.1–8.1)

## 2015-06-22 LAB — LIPID PANEL
CHOLESTEROL: 184 mg/dL (ref 125–200)
HDL: 89 mg/dL (ref >=46)
LDL Cholesterol: 78 mg/dL (ref <130)
TRIGLYCERIDES: 87 mg/dL (ref <150)
Total CHOL/HDL Ratio: 2.1 Ratio (ref <=5.0)
VLDL: 17 mg/dL (ref <30)

## 2015-06-22 LAB — CBC
HCT: 39.3 % (ref 36.0–46.0)
HEMOGLOBIN: 13.7 g/dL (ref 12.0–15.0)
MCH: 32.6 pg (ref 26.0–34.0)
MCHC: 34.9 g/dL (ref 30.0–36.0)
MCV: 93.6 fL (ref 78.0–100.0)
MPV: 9.2 fL (ref 8.6–12.4)
Platelets: 209 10*3/uL (ref 150–400)
RBC: 4.2 MIL/uL (ref 3.87–5.11)
RDW: 13.4 % (ref 11.5–15.5)
WBC: 4.3 10*3/uL (ref 4.0–10.5)

## 2015-06-22 LAB — POCT URINALYSIS DIPSTICK
Bilirubin, UA: NEGATIVE
GLUCOSE UA: NEGATIVE
KETONES UA: NEGATIVE
Leukocytes, UA: NEGATIVE
Nitrite, UA: NEGATIVE
Protein, UA: NEGATIVE
RBC UA: NEGATIVE
Urobilinogen, UA: NEGATIVE
pH, UA: 7

## 2015-06-22 NOTE — Progress Notes (Signed)
Patient ID: Erika Parsons, female   DOB: 06/19/1961, 54 y.o.   MRN: [id number] 54 y.o. [place] Married Caucasian female here for annual exam.    Status post bilateral mastectomy.  Had genetic testing and BRCA testing was negative.   Status post tamoxifen from July 2008 - June 2013.  Had postmenopausal bleeding while on tamoxifen.  EMB 2013 - simple endometrial hyperplasia without atypia. Treated with a course of Provera therapy.  Pelvic ultrasound 2013 showed EMB 6 mm. Myometrium normal. Right ovary normal. 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  Denies any problems with bladder or bowel function.   Brother died this last year.  Cut his arm on glass on a fish tank and bled to death.  Settling his estate.  States she is doing OK  Going on a cruise with mother.   PCP:   None  Patient's last menstrual period was 10/12/2009.          Sexually active: Yes.  female  The current method of family planning is post menopausal status.    Exercising: Yes.    aerobics, toning and joggins. Smoker:  no  Health Maintenance: Pap:  06-03-13 Neg:Neg HR HPV History of abnormal Pap:  Yes, 2009 Ascus but no treatment. MMG: 2008 MRI of Breast: BiRads 6--Bil.mastectomy 2008 w/reconstruction - Dr. Lucia Gaskins and Dr. Harlow Mares. Colonoscopy: 07-05-11 normal with Dr. Fuller Plan. Next due 06/2021.  BMD:   03/2007 Result  N/A TDaP:  2008 Screening Labs:  Hb today: 13.8, Urine today: Neg   reports that she has never smoked. She has never used smokeless tobacco. She reports that she drinks about 2.4 oz of alcohol per week. She reports that she does not use illicit drugs.  Past Medical History  Diagnosis Date  . Breast cancer 2008    bilateral mastectomy  . GERD (gastroesophageal reflux disease)     hiatal hernia  . 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 Breast cancer 02/2007    Right breast 2.8 cm + micromets in sentinel node  .  Simple endometrial hyperplasia 07/15/12    on bx 2013, done for PMB (with three months with tamox)    Past Surgical History  Procedure Laterality Date  . Bilateral mastectomy  04/2007    reconstruction, tamoxifen no SSRI while on it no rt or chemo. Started tamoxifen 7/08- quite June 2013  . Adenoidectomy  1968  . Bilateral breast reconstruction  2008    Current Outpatient Prescriptions  Medication Sig Dispense Refill  . Ascorbic Acid (VITAMIN C) 1000 MG tablet Take 1,000 mg by mouth daily.      . B Complex-Biotin-FA (B COMPLEX 100 TR PO) Take by mouth daily.      . calcium carbonate (TUMS - DOSED IN MG ELEMENTAL CALCIUM) 500 MG chewable tablet Chew 1 tablet by mouth daily.    . Calcium Carbonate-Vitamin D (CALCIUM 600+D) 600-400 MG-UNIT per tablet Take 1 tablet by mouth daily.      . Cholecalciferol (VITAMIN D HIGH POTENCY) 1000 UNITS capsule Take 1,000 Units by mouth daily.      . fish oil-omega-3 fatty acids 1000 MG capsule Take 2 g by mouth daily. Takes 1200 mg daily     . Iodine, Kelp, (KELP PO) Take by mouth. Takes 250 mg daily     . Multiple Vitamin (MULTIVITAMIN) tablet Take 1 tablet by mouth daily.      Marland Kitchen  triamcinolone (KENALOG) 0.1 % cream as needed.     . Zinc 50 MG CAPS Take by mouth.     No current facility-administered medications for this visit.    Family History  Problem Relation Age of Onset  . Adopted: Yes    ROS:  Pertinent items are noted in HPI.  Otherwise, a comprehensive ROS was negative.  Exam:   Pulse 64  Resp 16  Ht _0  (1.626 m)  Wt 127 lb (57.607 kg)  BMI 21.79 kg/m2  LMP 10/12/2009    General appearance: alert, cooperative and appears stated age Head: Normocephalic, without obvious abnormality, atraumatic Neck: no adenopathy, supple, symmetrical, trachea midline and thyroid normal to inspection and palpation Lungs: clear to auscultation bilaterally Breasts: normal appearance, no masses or tenderness,  bilateral mastectomy with reconstruction  procedure and implants.  No axillary nodes palpable. Heart: regular rate and rhythm Abdomen: soft, non-tender; bowel sounds normal; no masses,  no organomegaly Extremities: extremities normal, atraumatic, no cyanosis or edema Skin: Skin color, texture, turgor normal. No rashes or lesions Lymph nodes: Cervical, supraclavicular, and axillary nodes normal. No abnormal inguinal nodes palpated Neurologic: Grossly normal  Pelvic: External genitalia:  no lesions              Urethra:  normal appearing urethra with no masses, tenderness or lesions              Bartholins and Skenes: normal                 Vagina: normal appearing vagina with normal color and discharge, no lesions              Cervix: no lesions              Pap taken: Yes.   Bimanual Exam:  Uterus:  normal size, contour, position, consistency, mobility, non-tender              Adnexa: normal adnexa and no mass, fullness, tenderness              Rectovaginal: Yes.  .  Confirms.              Anus:  normal sphincter tone, no lesions  Chaperone was present for exam.  Assessment:   Well woman visit with normal exam. Hx of breast cancer.  Status post bilateral mastectomy with reconstruction.  Hx endometrial hyperplasia.  No vaginal bleeding.   Plan: Mammogram not needed. Recommended self breast exam.  Pap and HR HPV as above. Discussed Calcium, Vitamin D, regular exercise program including cardiovascular and weight bearing exercise. Labs performed.  Yes.  .   See orders. Refills given on medications.  No..    Advised to call for any postmenopausal bleeding.  Support given for loss of brother. Follow up annually and prn.      After visit summary provided.

## 2015-06-23 LAB — TSH: TSH: 2.726 u[IU]/mL (ref 0.350–4.500)

## 2015-06-23 LAB — HEMOGLOBIN, FINGERSTICK: Hemoglobin, fingerstick: 13.8 g/dL (ref 12.0–16.0)

## 2015-06-23 LAB — VITAMIN D 25 HYDROXY (VIT D DEFICIENCY, FRACTURES): Vit D, 25-Hydroxy: 46 ng/mL (ref 30–100)

## 2015-06-27 LAB — IPS PAP TEST WITH HPV

## 2016-07-18 ENCOUNTER — Ambulatory Visit (INDEPENDENT_AMBULATORY_CARE_PROVIDER_SITE_OTHER): Payer: 59 | Admitting: Obstetrics and Gynecology

## 2016-07-18 ENCOUNTER — Encounter: Payer: Self-pay | Admitting: Obstetrics and Gynecology

## 2016-07-18 ENCOUNTER — Other Ambulatory Visit: Payer: Self-pay | Admitting: Obstetrics and Gynecology

## 2016-07-18 VITALS — BP 110/72 | HR 66 | Resp 14 | Ht 63.74 in | Wt 128.6 lb

## 2016-07-18 DIAGNOSIS — Z01419 Encounter for gynecological examination (general) (routine) without abnormal findings: Secondary | ICD-10-CM

## 2016-07-18 DIAGNOSIS — R7989 Other specified abnormal findings of blood chemistry: Secondary | ICD-10-CM

## 2016-07-18 DIAGNOSIS — Z0000 Encounter for general adult medical examination without abnormal findings: Secondary | ICD-10-CM

## 2016-07-18 DIAGNOSIS — Z113 Encounter for screening for infections with a predominantly sexual mode of transmission: Secondary | ICD-10-CM

## 2016-07-18 LAB — POCT URINALYSIS DIPSTICK
Bilirubin, UA: NEGATIVE
Blood, UA: NEGATIVE
Clarity, UA: NEGATIVE
Glucose, UA: NEGATIVE
KETONES UA: NEGATIVE
Leukocytes, UA: NEGATIVE
Nitrite, UA: NEGATIVE
PH UA: 5
Protein, UA: NEGATIVE
Urobilinogen, UA: NEGATIVE

## 2016-07-18 LAB — CBC
HCT: 40.8 % (ref 35.0–45.0)
Hemoglobin: 14.3 g/dL (ref 11.7–15.5)
MCH: 33.1 pg — ABNORMAL HIGH (ref 27.0–33.0)
MCHC: 35 g/dL (ref 32.0–36.0)
MCV: 94.4 fL (ref 80.0–100.0)
MPV: 9.7 fL (ref 7.5–12.5)
PLATELETS: 206 10*3/uL (ref 140–400)
RBC: 4.32 MIL/uL (ref 3.80–5.10)
RDW: 12.7 % (ref 11.0–15.0)
WBC: 4 10*3/uL (ref 3.8–10.8)

## 2016-07-18 LAB — LIPID PANEL
CHOL/HDL RATIO: 2.1 ratio (ref <=5.0)
Cholesterol: 230 mg/dL — ABNORMAL HIGH (ref 125–200)
HDL: 111 mg/dL (ref >=46)
LDL CALC: 99 mg/dL (ref <130)
Triglycerides: 98 mg/dL (ref <150)
VLDL: 20 mg/dL (ref <30)

## 2016-07-18 LAB — COMPREHENSIVE METABOLIC PANEL
ALK PHOS: 58 U/L (ref 33–130)
ALT: 18 U/L (ref 6–29)
AST: 26 U/L (ref 10–35)
Albumin: 4.5 g/dL (ref 3.6–5.1)
BUN: 13 mg/dL (ref 7–25)
CO2: 29 mmol/L (ref 20–31)
CREATININE: 0.82 mg/dL (ref 0.50–1.05)
Calcium: 9.9 mg/dL (ref 8.6–10.4)
Chloride: 95 mmol/L — ABNORMAL LOW (ref 98–110)
GLUCOSE: 92 mg/dL (ref 65–99)
Potassium: 4.1 mmol/L (ref 3.5–5.3)
SODIUM: 133 mmol/L — AB (ref 135–146)
TOTAL PROTEIN: 7 g/dL (ref 6.1–8.1)
Total Bilirubin: 0.6 mg/dL (ref 0.2–1.2)

## 2016-07-18 LAB — TSH: TSH: 4.63 m[IU]/L — AB

## 2016-07-18 LAB — HEMOGLOBIN, FINGERSTICK: Hemoglobin, fingerstick: 14.6 g/dL (ref 12.0–16.0)

## 2016-07-18 NOTE — Patient Instructions (Signed)

## 2016-07-18 NOTE — Progress Notes (Signed)
54 y.o. G61P0000 Married Caucasian female here for annual exam.    Had pain under right arm.  Saw her surgeon, Dr. Lucia Gaskins, who did an office ultrasound which was normal. Hx of mastectomy.  Pain is now better.   No vaginal bleeding or spotting.   Back from an [place].   PCP:  None   Patient's last menstrual period was 10/12/2009 (approximate).           Sexually active: Yes.   female The current method of family planning is post menopausal status.    Exercising: Yes.    Aerobics and toning 3-4x/week Smoker:  no  Health Maintenance: Pap:  06-03-13 Neg:Neg HR HPV History of abnormal Pap:  Yes, 2009 hx Ascus pap--no treatment MMG:  Hx bilateral mastectomy w/reconstruction--hx Rt.breast Ca/BRCA negative--MRI 2008  Colonoscopy: 07-05-11 normal with Dr. Neoma Laming due 2022. BMD:   03/2007  Result  normal TDaP:  2008 Gardasil:   N/A HIV:  today Hep C:  today Screening Labs:   Urine today:  Negative.   reports that she has never smoked. She has never used smokeless tobacco. She reports that she drinks about 2.4 oz of alcohol per week . She reports that she does not use drugs.  Past Medical History:  Diagnosis Date  . Breast cancer [facility]) 2008   bilateral mastectomy  . Breast cancer ([place]) 02/2007   Right breast 2.8 cm + micromets in sentinel node  . 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 GERD (gastroesophageal reflux disease)    hiatal hernia  . 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 ADENOIDECTOMY  1968  . bilateral breast reconstruction  2008  . bilateral mastectomy  04/2007   reconstruction, tamoxifen no SSRI while on it no rt or chemo. Started tamoxifen 7/08- quite June 2013    Current Outpatient Prescriptions  Medication Sig Dispense Refill  . Ascorbic Acid (VITAMIN C) 1000 MG tablet Take 1,000 mg by mouth daily.      . B Complex-Biotin-FA (B  COMPLEX 100 TR PO) Take by mouth daily.      . calcium carbonate (TUMS - DOSED IN MG ELEMENTAL CALCIUM) 500 MG chewable tablet Chew 1 tablet by mouth daily.    . Calcium Carbonate-Vitamin D (CALCIUM 600+D) 600-400 MG-UNIT per tablet Take 1 tablet by mouth daily.      . Cholecalciferol (VITAMIN D HIGH POTENCY) 1000 UNITS capsule Take 1,000 Units by mouth daily.      . fish oil-omega-3 fatty acids 1000 MG capsule Take 2 g by mouth daily. Takes 1200 mg daily     . Iodine, Kelp, (KELP PO) Take by mouth. Takes 250 mg daily     . Multiple Vitamin (MULTIVITAMIN) tablet Take 1 tablet by mouth daily.      Marland Kitchen triamcinolone (KENALOG) 0.1 % cream as needed.     . Zinc 50 MG CAPS Take by mouth.     No current facility-administered medications for this visit.     Family History  Problem Relation Age of Onset  . Adopted: Yes    ROS:  Pertinent items are noted in HPI.  Otherwise, a comprehensive ROS was negative.  Exam:   BP 110/72 (BP Location: Right Arm, Patient Position: Sitting, Cuff Size: Normal)   Pulse 66   Resp 14   Ht 5' 3.74" (1.619 m)  Wt 128 lb 9.6 oz (58.3 kg)   LMP 10/12/2009 (Approximate)   BMI 22.25 kg/m     General appearance: alert, cooperative and appears stated age Head: Normocephalic, without obvious abnormality, atraumatic Neck: no adenopathy, supple, symmetrical, trachea midline and thyroid normal to inspection and palpation Lungs: clear to auscultation bilaterally Breasts:  Consistent with bilateral mastectomy and reconstruction.  No axillary nodes palpable. Heart: regular rate and rhythm Abdomen: soft, non-tender; no masses, no organomegaly Extremities: extremities normal, atraumatic, no cyanosis or edema Skin: Skin color, texture, turgor normal. No rashes or lesions Lymph nodes: Cervical, supraclavicular, and axillary nodes normal. No abnormal inguinal nodes palpated Neurologic: Grossly normal  Pelvic: External genitalia:  no lesions              Urethra:  normal  appearing urethra with no masses, tenderness or lesions              Bartholins and Skenes: normal                 Vagina: normal appearing vagina with normal color and discharge, no lesions              Cervix: no lesions              Pap taken: Yes.   Bimanual Exam:  Uterus:  normal size, contour, position, consistency, mobility, non-tender              Adnexa: no mass, fullness, tenderness              Rectal exam: Yes.  .  Confirms.              Anus:  normal sphincter tone, no lesions  Chaperone was present for exam.  Assessment:   Well woman visit with normal exam. Hx bilateral mastectomy for breast cancer.  BRCA negative.  Hx endometrial hyperplasia.   Plan: Recommended self breast/chest wall and axillary exam.  Pap and HR HPV as above. Discussed Calcium, Vitamin D, regular exercise program including cardiovascular and weight bearing exercise. Routine labs including HIV and Hep C aby.  Follow up annually and prn.        After visit summary provided.

## 2016-07-19 LAB — HEPATITIS C ANTIBODY: HCV Ab: NEGATIVE

## 2016-07-19 LAB — VITAMIN D 25 HYDROXY (VIT D DEFICIENCY, FRACTURES): Vit D, 25-Hydroxy: 54 ng/mL (ref 30–100)

## 2016-07-19 LAB — HIV ANTIBODY (ROUTINE TESTING W REFLEX): HIV 1&2 Ab, 4th Generation: NONREACTIVE

## 2016-07-20 LAB — IPS PAP TEST WITH HPV

## 2016-07-20 NOTE — Addendum Note (Signed)
Addended by: Yisroel Ramming, Dietrich Pates E on: 07/20/2016 05:47 PM   Modules accepted: Orders

## 2016-07-23 LAB — T4, FREE: Free T4: 1.4 ng/dL (ref 0.8–1.8)

## 2016-07-24 NOTE — Addendum Note (Signed)
Addended by: Yisroel Ramming, Shaunte Tuft E on: 07/24/2016 11:50 AM   Modules accepted: Orders

## 2016-08-01 ENCOUNTER — Emergency Department (HOSPITAL_BASED_OUTPATIENT_CLINIC_OR_DEPARTMENT_OTHER)
Admission: EM | Admit: 2016-08-01 | Discharge: 2016-08-01 | Disposition: A | Discharge status: Home / Self Care | Payer: 59 | Attending: Emergency Medicine | Admitting: Emergency Medicine

## 2016-08-01 ENCOUNTER — Encounter (HOSPITAL_BASED_OUTPATIENT_CLINIC_OR_DEPARTMENT_OTHER): Payer: Self-pay

## 2016-08-01 DIAGNOSIS — W228XXA Striking against or struck by other objects, initial encounter: Secondary | ICD-10-CM | POA: Diagnosis not present

## 2016-08-01 DIAGNOSIS — Y939 Activity, unspecified: Secondary | ICD-10-CM | POA: Insufficient documentation

## 2016-08-01 DIAGNOSIS — Y999 Unspecified external cause status: Secondary | ICD-10-CM | POA: Insufficient documentation

## 2016-08-01 DIAGNOSIS — Z853 Personal history of malignant neoplasm of breast: Secondary | ICD-10-CM | POA: Diagnosis not present

## 2016-08-01 DIAGNOSIS — S01511A Laceration without foreign body of lip, initial encounter: Secondary | ICD-10-CM | POA: Insufficient documentation

## 2016-08-01 DIAGNOSIS — Y92810 Car as the place of occurrence of the external cause: Secondary | ICD-10-CM | POA: Diagnosis not present

## 2016-08-01 MED ORDER — LIDOCAINE HCL (PF) 1 % IJ SOLN
2.0000 mL | Freq: Once | INTRAMUSCULAR | Status: AC
  Administered 2016-08-01: 2 mL
  Filled 2016-08-01: qty 5

## 2016-08-01 NOTE — ED Triage Notes (Signed)
Upper lip laceration from car door injury approx 530pm today-pt has butterfly strip in place with no bleed through-NAD-steady gait

## 2016-08-01 NOTE — Discharge Instructions (Signed)
You were seen in the ED today for a lip laceration. We repaired it with one suture. This suture will need to be removed in 4-5 days but no later than 5 days. You will likely have a small scar here. This can be revised by a plastic surgeon in 9 months to 1 year from now if you are not pleased with the cosmetic result.   Return to the ED with any sudden swelling of the cut, drainage, or uncontrollable bleeding.

## 2016-08-01 NOTE — ED Provider Notes (Signed)
Emergency Department Provider Note By signing my name below, I, Erika Parsons, attest that this documentation has been prepared under the direction and in the presence of Erika Fast, MD . Electronically Signed: Evelene Parsons, Scribe. 08/01/2016. 11:29 PM.   I have reviewed the triage vital signs and the nursing notes.   HISTORY  Chief Complaint Lip Laceration   HPI Comments:  Erika Parsons is a 55 y.o. female who presents to the Emergency Department complaining of small laceration to the upper lip. Pt injured the lip today ~1700; states she struck her face with the corner of the car door. She rates her pain a 2/10. Tetanus is UTD--2008. Pt has no other complaints or symptoms at this time.  She denies neck pain and vision change. Some pain with speaking and/or eating with movement of the laceration. No active bleeding. Denies any dental pain or sensation of loose teeth.    Past Medical History:  Diagnosis Date  . Breast cancer [facility]) 2008   bilateral mastectomy  . Breast cancer ([place]) 02/2007   Right breast 2.8 cm + micromets in sentinel node  . 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 GERD (gastroesophageal reflux disease)    hiatal hernia  . Simple endometrial hyperplasia 07/15/12   on bx 2013, done for PMB (with three months with tamox)    Patient Active Problem List   Diagnosis Date Noted  . Breast cancer, Right. T2, N74mic. Mastectomy 04/23/2007. 05/02/2012    Past Surgical History:  Procedure Laterality Date  . ADENOIDECTOMY  1968  . bilateral breast reconstruction  2008  . bilateral mastectomy  04/2007   reconstruction, tamoxifen no SSRI while on it no rt or chemo. Started tamoxifen 7/08- quite June 2013    Current Outpatient Rx  . Order #: [id number] Class: Historical Med  . Order #: [id number] Class: Historical Med  . Order #: [id number] Class: Historical Med  . Order #: [id number] Class: Historical Med  . Order #: [id number] Class: Historical Med  . Order #:  [id number] Class: Historical Med  . Order #: [id number] Class: Historical Med  . Order #: [id number] Class: Historical Med  . Order #: [id number] Class: Historical Med  . Order #: [id number] Class: Historical Med    Allergies Prednisone  Family History  Problem Relation Age of Onset  . Adopted: Yes    Social History Social History  Substance Use Topics  . Smoking status: Never Smoker  . Smokeless tobacco: Never Used  . Alcohol use 2.4 oz/week    4 Glasses of wine per week     Comment: 4-5 glasses of wine a week or a bloody mary    Review of Systems Constitutional: No fever/chills Eyes: No visual changes. ENT: No sore throat. Cardiovascular: Denies chest pain. Respiratory: Denies shortness of breath. Gastrointestinal: No abdominal pain.  No nausea, no vomiting.  No diarrhea.  No constipation. Genitourinary: Negative for dysuria. Musculoskeletal: Negative for back pain. Skin: Negative for rash. Positive for wound. Neurological: Negative for headaches, focal weakness or numbness.  10-point ROS otherwise negative.  ____________________________________________   PHYSICAL EXAM:  VITAL SIGNS: ED Triage Vitals  Enc Vitals Group     BP 08/01/16 2106 132/87     Pulse Rate 08/01/16 2106 66     Resp 08/01/16 2106 18     Temp 08/01/16 2106 98.6 F (37 C)     Temp Source 08/01/16 2106 Oral     SpO2 08/01/16 2106 98 %     Weight 08/01/16 2107 120 lb (  54.4 kg)     Height 08/01/16 2107 5\' 4"  (1.626 m)     Pain Score 08/01/16 2103 2   Constitutional: Alert and oriented. Well appearing and in no acute distress. Eyes: Conjunctivae are normal.  Head: Atraumatic Nose: No congestion/rhinnorhea. Mouth/Throat: Mucous membranes are moist.  Oropharynx non-erythematous. 0.5 cm laceration to the right upper lip involving the vermilion border.  Neck: No stridor.  Cardiovascular: Normal rate, regular rhythm. Good peripheral circulation. Grossly normal heart sounds.   Respiratory: Normal  respiratory effort.  No retractions. Lungs CTAB. Gastrointestinal: Soft and nontender. No distention.  Musculoskeletal: No lower extremity tenderness nor edema. No gross deformities of extremities. Neurologic:  Normal speech and language. No gross focal neurologic deficits are appreciated.  Skin:  Skin is warm, dry and intact. No rash noted. (laceration noted above).   ____________________________________________ DIAGNOSTIC STUDIES:  Oxygen Saturation is 98% on RA, normal by my interpretation.    COORDINATION OF CARE:  9:36 PM Discussed treatment plan with pt at bedside and pt agreed to plan.  ____________________________________________   PROCEDURES  Procedure(s) performed:   Marland KitchenMarland KitchenLaceration Repair Date/Time: 08/01/2016 9:35 PM Performed by: Polette Nofsinger, Wonda Olds Authorized by: Erika Parsons   Consent:    Consent obtained:  Verbal   Consent given by:  Patient   Risks discussed:  Poor cosmetic result Anesthesia (see MAR for exact dosages):    Anesthesia method:  Local infiltration   Local anesthetic:  Lidocaine 1% w/o epi Laceration details:    Location:  Lip   Lip location:  Upper exterior lip (involving the vermilion border)   Length (cm):  0.5   Depth (mm):  3 Repair type:    Repair type:  Intermediate Pre-procedure details:    Preparation:  Patient was prepped and draped in usual sterile fashion Exploration:    Hemostasis achieved with:  Direct pressure   Wound exploration: wound explored through full range of motion and entire depth of wound probed and visualized     Wound extent: no foreign bodies/material noted, no muscle damage noted, no nerve damage noted and no vascular damage noted     Contaminated: no   Treatment:    Area cleansed with:  Saline   Amount of cleaning:  Extensive   Irrigation solution:  Sterile saline   Irrigation volume:  250   Visualized foreign bodies/material removed: no   Skin repair:    Repair method:  Sutures   Suture size:  6-0   Suture  material:  Prolene   Suture technique:  Simple interrupted   Number of sutures:  1 Approximation:    Approximation:  Close   [place] border: well-aligned   Post-procedure details:    Dressing:  Open (no dressing)   Patient tolerance of procedure:  Tolerated well, no immediate complications    ____________________________________________   INITIAL IMPRESSION / ASSESSMENT AND PLAN / ED COURSE  Pertinent labs & imaging results that were available during my care of the patient were reviewed by me and considered in my medical decision making (see chart for details).  Patient presents to the emergency department for evaluation of laceration to the upper lip involving the vermilion border. The wound is already well approximated on arrival with Steri-Strips in place. No full thickness fracture identified. No clear dental trauma. No apparent nerve or muscle injury. No evidence of underlying fracture. Discussed the procedure in detail with the patient and her husband at bedside. Advised that she will have a scar but that this may be  revised in the future by consulting a plastic surgeon. The laceration is somewhat complicated by the fact that it does involve the [place] border. The patient required 1 suture at the vermilion border with a nonabsorbable Prolene suture. She will return in 5 days' time for removal of the suture. The remainder of the wound is well approximated and did not require additional suturing.   ____________________________________________  FINAL CLINICAL IMPRESSION(S) / ED DIAGNOSES  Final diagnoses:  Lip laceration, initial encounter     MEDICATIONS GIVEN DURING THIS VISIT:  Medications  lidocaine (PF) (XYLOCAINE) 1 % injection 2 mL (2 mLs Infiltration Given by Other 08/01/16 2143)     NEW OUTPATIENT MEDICATIONS STARTED DURING THIS VISIT:  None  Documentation performed with the assistance of a scribe. I reviewed the documentation and made changes as needed.     Note:  This document was prepared using Dragon voice recognition software and may include unintentional dictation errors.  Nanda Quinton, MD Emergency Medicine    Erika Fast, MD 08/01/16 [phone]

## 2016-08-05 ENCOUNTER — Emergency Department (HOSPITAL_BASED_OUTPATIENT_CLINIC_OR_DEPARTMENT_OTHER)
Admission: EM | Admit: 2016-08-05 | Discharge: 2016-08-05 | Disposition: A | Discharge status: Home / Self Care | Payer: 59 | Attending: Emergency Medicine | Admitting: Emergency Medicine

## 2016-08-05 ENCOUNTER — Encounter (HOSPITAL_BASED_OUTPATIENT_CLINIC_OR_DEPARTMENT_OTHER): Payer: Self-pay | Admitting: Emergency Medicine

## 2016-08-05 DIAGNOSIS — Z4802 Encounter for removal of sutures: Secondary | ICD-10-CM | POA: Insufficient documentation

## 2016-08-05 DIAGNOSIS — Z853 Personal history of malignant neoplasm of breast: Secondary | ICD-10-CM | POA: Insufficient documentation

## 2016-08-05 NOTE — ED Triage Notes (Signed)
Patient had a stitch placed in her upper lip on Wednesday, is here today for suture removal. Patient states it is still hurting.

## 2016-08-05 NOTE — Discharge Instructions (Signed)
Continue to keep the wound clean and dry. Follow-up with your PCP as needed.

## 2016-08-05 NOTE — ED Notes (Signed)
Pt given d/c instructions as per chart. Verbalizes understanding. No questions. 

## 2016-08-05 NOTE — ED Provider Notes (Signed)
[facility] DEPT Provider Note   CSN: [id number] Arrival date & time: 08/05/16  1644 By signing my name below, I, Dyke Brackett, attest that this documentation has been prepared under the direction and in the presence of non-physician practitioner, Arlean Hopping, PA-C  Electronically Signed: Dyke Brackett, Scribe. 08/05/2016. 6:07 PM.   History   Chief Complaint Chief Complaint  Patient presents with  . Suture / Staple Removal    upper lip    HPI Erika Parsons is a 55 y.o. female who presents to the Emergency Department for removal of suture of small laceration to upper lip. Pt had stitch placed here 4 days ago. She states the wound has been healing well. Pt denies fever, vomiting, dizziness, or repeat trauma. She has no other complaints at this time.   The history is provided by the patient. No language interpreter was used.   Past Medical History:  Diagnosis Date  . Breast cancer [facility]) 2008   bilateral mastectomy  . Breast cancer ([place]) 02/2007   Right breast 2.8 cm + micromets in sentinel node  . 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 GERD (gastroesophageal reflux disease)    hiatal hernia  . Simple endometrial hyperplasia 07/15/12   on bx 2013, done for PMB (with three months with tamox)    Patient Active Problem List   Diagnosis Date Noted  . Breast cancer, Right. T2, N100mic. Mastectomy 04/23/2007. 05/02/2012    Past Surgical History:  Procedure Laterality Date  . ADENOIDECTOMY  1968  . bilateral breast reconstruction  2008  . bilateral mastectomy  04/2007   reconstruction, tamoxifen no SSRI while on it no rt or chemo. Started tamoxifen 7/08- quite June 2013    OB History    Gravida Para Term Preterm AB Living   0 0 0 0 0 0   SAB TAB Ectopic Multiple Live Births   0 0 0 0         Home Medications    Prior to Admission medications   Medication Sig Start Date End Date Taking? Authorizing Provider  Ascorbic Acid (VITAMIN C) 1000 MG tablet Take 1,000  mg by mouth daily.      Historical Provider, MD  B Complex-Biotin-FA (B COMPLEX 100 TR PO) Take by mouth daily.      Historical Provider, MD  calcium carbonate (TUMS - DOSED IN MG ELEMENTAL CALCIUM) 500 MG chewable tablet Chew 1 tablet by mouth daily.    Historical Provider, MD  Calcium Carbonate-Vitamin D (CALCIUM 600+D) 600-400 MG-UNIT per tablet Take 1 tablet by mouth daily.      Historical Provider, MD  Cholecalciferol (VITAMIN D HIGH POTENCY) 1000 UNITS capsule Take 1,000 Units by mouth daily.      Historical Provider, MD  fish oil-omega-3 fatty acids 1000 MG capsule Take 2 g by mouth daily. Takes 1200 mg daily     Historical Provider, MD  Iodine, Kelp, (KELP PO) Take by mouth. Takes 250 mg daily     Historical Provider, MD  Multiple Vitamin (MULTIVITAMIN) tablet Take 1 tablet by mouth daily.      Historical Provider, MD  triamcinolone (KENALOG) 0.1 % cream as needed.  03/26/11   Historical Provider, MD  Zinc 50 MG CAPS Take by mouth.    Historical Provider, MD    Family History Family History  Problem Relation Age of Onset  . Adopted: Yes    Social History Social History  Substance Use Topics  . Smoking status: Never  Smoker  . Smokeless tobacco: Never Used  . Alcohol use 2.4 oz/week    4 Glasses of wine per week     Comment: 4-5 glasses of wine a week or a bloody mary     Allergies   Prednisone   Review of Systems Review of Systems  Constitutional: Negative for fever.  Gastrointestinal: Negative for vomiting.  Skin: Positive for wound.  Neurological: Negative for dizziness.   Physical Exam Updated Vital Signs BP 118/87 (BP Location: Left Arm)   Pulse 76   Temp 98.6 F (37 C) (Oral)   Resp 18   Ht 5\' 4"  (1.626 m)   Wt 54.4 kg   LMP 10/12/2009 (Approximate)   SpO2 100%   BMI 20.60 kg/m   Physical Exam  Constitutional: She appears well-developed and well-nourished. No distress.  HENT:  Head: Normocephalic and atraumatic.  Eyes: Conjunctivae are normal.    Neck: Neck supple.  Cardiovascular: Normal rate and regular rhythm.   Pulmonary/Chest: Effort normal.  Abdominal: She exhibits no distension.  Neurological: She is alert.  Skin: Skin is warm and dry. She is not diaphoretic.  Single suture in upper lip; no swelling or signs of infection. No drainage noted. Wound is clean and appears well healed   Psychiatric: She has a normal mood and affect. Her behavior is normal.  Nursing note and vitals reviewed.  ED Treatments / Results  DIAGNOSTIC STUDIES:  Oxygen Saturation is 100% on RA, normal by my interpretation.    COORDINATION OF CARE:  6:07 PM Discussed treatment plan with pt at bedside and pt agreed to plan.   Labs (all labs ordered are listed, but only abnormal results are displayed) Labs Reviewed - No data to display  EKG  EKG Interpretation None       Radiology No results found.  Procedures .Suture Removal Date/Time: 08/05/2016 6:06 PM Performed by: Lorayne Bender Authorized by: Lorayne Bender   Consent:    Consent obtained:  Verbal   Consent given by:  Patient   Risks discussed:  Bleeding, pain and wound separation Location:    Location:  Mouth   Mouth location:  Upper exterior lip Procedure details:    Wound appearance:  No signs of infection   Number of sutures removed:  1 Post-procedure details:    Post-removal:  No dressing applied   Patient tolerance of procedure:  Tolerated well, no immediate complications    (including critical care time)  Medications Ordered in ED Medications - No data to display   Initial Impression / Assessment and Plan / ED Course  I have reviewed the triage vital signs and the nursing notes.  Pertinent labs & imaging results that were available during my care of the patient were reviewed by me and considered in my medical decision making (see chart for details).  Clinical Course    Patient presents for suture removal. Wound appears well-healed without signs of infection.  Patient to follow up with PCP as needed.  Final Clinical Impressions(s) / ED Diagnoses   Final diagnoses:  Visit for suture removal    New Prescriptions Discharge Medication List as of 08/05/2016  6:06 PM     I personally performed the services described in this documentation, which was scribed in my presence. The recorded information has been reviewed and is accurate.    Lorayne Bender, PA-C 08/06/16 [place], MD 08/08/16 [phone]

## 2016-10-24 ENCOUNTER — Other Ambulatory Visit (INDEPENDENT_AMBULATORY_CARE_PROVIDER_SITE_OTHER): Payer: 59

## 2016-10-24 ENCOUNTER — Other Ambulatory Visit: Payer: Self-pay

## 2016-10-24 DIAGNOSIS — R946 Abnormal results of thyroid function studies: Secondary | ICD-10-CM

## 2016-10-24 LAB — THYROID PANEL WITH TSH
Free Thyroxine Index: 2.5 (ref 1.4–3.8)
T3 Uptake: 35 % (ref 22–35)
T4 TOTAL: 7 ug/dL (ref 4.5–12.0)
TSH: 2.62 mIU/L

## 2017-07-24 NOTE — Progress Notes (Signed)
56 y.o. G22P0000 Married Caucasian female here for annual exam.    Denies any vaginal bleeding.  Wants labs here today.   Mother fell twice and broke her pelvis twice per patient.  Has osteoporosis.  Patient and her husband are living with her now.   Patient is also working full time.   PCP:  C.Melinda Crutch, MD    Patient's last menstrual period was 10/12/2009 (approximate).           Sexually active: Yes.   female The current method of family planning is post menopausal status.    Exercising: Yes.    Aerobics, jogging, toning and eliptical Smoker:  no  Health Maintenance: Pap:  07-18-16 Neg:Neg HR HPV, 06-03-13 Neg:Neg HR HPV History of abnormal Pap:  Yes, 2009 hx of ASCUS pap--no treatment. MMG:  Hx bilateral mastectomy w/reconstruction--hx Rt.breast Ca/BRCA negative--MRI 2008  Colonoscopy: 07-05-11 normal with Dr. Neoma Laming due 2022. BMD: 5/08  Result  Normal TDaP: 01/2007--Pt.would like today Gardasil:   no HIV: 07-18-16 NR Hep C: 07-18-16 Neg Screening Labs:    Urine today: not done   reports that she has never smoked. She has never used smokeless tobacco. She reports that she drinks about 3.0 oz of alcohol per week . She reports that she does not use drugs.  Past Medical History:  Diagnosis Date  . Breast cancer [facility]) 2008   bilateral mastectomy  . Breast cancer ([place]) 02/2007   Right breast 2.8 cm + micromets in sentinel node  . 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 GERD (gastroesophageal reflux disease)    hiatal hernia  . 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 ADENOIDECTOMY  1968  . bilateral breast reconstruction  2008  . bilateral mastectomy  04/2007   reconstruction, tamoxifen no SSRI while on it no rt or chemo. Started tamoxifen 7/08- quite June 2013    Current Outpatient Prescriptions  Medication Sig Dispense Refill  . Ascorbic Acid  (VITAMIN C) 1000 MG tablet Take 1,000 mg by mouth daily.      . B Complex-Biotin-FA (B COMPLEX 100 TR PO) Take by mouth daily.      . calcium carbonate (TUMS - DOSED IN MG ELEMENTAL CALCIUM) 500 MG chewable tablet Chew 1 tablet by mouth daily.    . Calcium Carbonate-Vitamin D (CALCIUM 600+D) 600-400 MG-UNIT per tablet Take 1 tablet by mouth daily.      . Cholecalciferol (VITAMIN D HIGH POTENCY) 1000 UNITS capsule Take 1,000 Units by mouth daily.      . fish oil-omega-3 fatty acids 1000 MG capsule Take 2 g by mouth daily. Takes 1200 mg daily     . Iodine, Kelp, (KELP PO) Take by mouth. Takes 250 mg daily     . Multiple Vitamin (MULTIVITAMIN) tablet Take 1 tablet by mouth daily.      . Zinc 50 MG CAPS Take by mouth.     No current facility-administered medications for this visit.     Family History  Problem Relation Age of Onset  . Adopted: Yes  . Broken bones Mother 71       broken pelvis    ROS:  Pertinent items are noted in HPI.  Otherwise, a comprehensive ROS was negative.  Exam:   BP 100/62 (BP Location: Right Arm, Patient Position: Sitting, Cuff Size: Normal)   Pulse 70  Resp 14   Ht 5' 4"  (1.626 m)   Wt 125 lb (56.7 kg)   LMP 10/12/2009 (Approximate)   BMI 21.46 kg/m     General appearance: alert, cooperative and appears stated age Head: Normocephalic, without obvious abnormality, atraumatic Neck: no adenopathy, supple, symmetrical, trachea midline and thyroid normal to inspection and palpation Lungs: clear to auscultation bilaterally Breasts: normal appearance, no masses or tenderness, No nipple retraction or dimpling, No nipple discharge or bleeding, No axillary or supraclavicular adenopathy Heart: regular rate and rhythm Abdomen: soft, non-tender; no masses, no organomegaly Extremities: extremities normal, atraumatic, no cyanosis or edema Skin: Skin color, texture, turgor normal. No rashes or lesions Lymph nodes: Cervical, supraclavicular, and axillary nodes  normal. No abnormal inguinal nodes palpated Neurologic: Grossly normal  Pelvic: External genitalia:  no lesions              Urethra:  normal appearing urethra with no masses, tenderness or lesions              Bartholins and Skenes: normal                 Vagina: normal appearing vagina with normal color and discharge, no lesions              Cervix: no lesions              Pap taken: No. Bimanual Exam:  Uterus:  normal size, contour, position, consistency, mobility, non-tender              Adnexa: no mass, fullness, tenderness              Rectal exam: Yes.  .  Confirms.              Anus:  normal sphincter tone, no lesions  Chaperone was present for exam.  Assessment:   Well woman visit with normal exam. Hx bilateral mastectomy for breast cancer.  BRCA neg.  Hx endometrial hyperplasia.   Plan: Mammogram screening discussed. Recommended self breast awareness. Pap and HR HPV as above. Guidelines for Calcium, Vitamin D, regular exercise program including cardiovascular and weight bearing exercise. TDap.  Routine labs. Declines BMD.  Follow up annually and prn.   After visit summary provided.

## 2017-07-31 ENCOUNTER — Ambulatory Visit (INDEPENDENT_AMBULATORY_CARE_PROVIDER_SITE_OTHER): Payer: 59 | Admitting: Obstetrics and Gynecology

## 2017-07-31 ENCOUNTER — Encounter: Payer: Self-pay | Admitting: Obstetrics and Gynecology

## 2017-07-31 VITALS — BP 100/62 | HR 70 | Resp 14 | Ht 64.0 in | Wt 125.0 lb

## 2017-07-31 DIAGNOSIS — Z01419 Encounter for gynecological examination (general) (routine) without abnormal findings: Secondary | ICD-10-CM

## 2017-07-31 DIAGNOSIS — Z23 Encounter for immunization: Secondary | ICD-10-CM | POA: Diagnosis not present

## 2017-07-31 NOTE — Patient Instructions (Signed)

## 2017-08-01 LAB — LIPID PANEL
CHOL/HDL RATIO: 2.3 ratio (ref 0.0–4.4)
Cholesterol, Total: 224 mg/dL — ABNORMAL HIGH (ref 100–199)
HDL: 98 mg/dL (ref >39)
LDL CALC: 109 mg/dL — AB (ref 0–99)
TRIGLYCERIDES: 85 mg/dL (ref 0–149)
VLDL Cholesterol Cal: 17 mg/dL (ref 5–40)

## 2017-08-01 LAB — COMPREHENSIVE METABOLIC PANEL
ALBUMIN: 4.5 g/dL (ref 3.5–5.5)
ALK PHOS: 70 IU/L (ref 39–117)
ALT: 15 IU/L (ref 0–32)
AST: 24 IU/L (ref 0–40)
Albumin/Globulin Ratio: 2.1 (ref 1.2–2.2)
BUN/Creatinine Ratio: 12 (ref 9–23)
BUN: 9 mg/dL (ref 6–24)
Bilirubin Total: 0.3 mg/dL (ref 0.0–1.2)
CO2: 26 mmol/L (ref 20–29)
Calcium: 9.6 mg/dL (ref 8.7–10.2)
Chloride: 97 mmol/L (ref 96–106)
Creatinine, Ser: 0.74 mg/dL (ref 0.57–1.00)
GFR calc Af Amer: 104 mL/min/{1.73_m2} (ref >59)
GFR calc non Af Amer: 90 mL/min/{1.73_m2} (ref >59)
GLOBULIN, TOTAL: 2.1 g/dL (ref 1.5–4.5)
Glucose: 87 mg/dL (ref 65–99)
POTASSIUM: 4.1 mmol/L (ref 3.5–5.2)
SODIUM: 138 mmol/L (ref 134–144)
Total Protein: 6.6 g/dL (ref 6.0–8.5)

## 2017-08-01 LAB — CBC
HEMATOCRIT: 40 % (ref 34.0–46.6)
HEMOGLOBIN: 13.7 g/dL (ref 11.1–15.9)
MCH: 32 pg (ref 26.6–33.0)
MCHC: 34.3 g/dL (ref 31.5–35.7)
MCV: 94 fL (ref 79–97)
Platelets: 224 10*3/uL (ref 150–379)
RBC: 4.28 x10E6/uL (ref 3.77–5.28)
RDW: 12.9 % (ref 12.3–15.4)
WBC: 4.7 10*3/uL (ref 3.4–10.8)

## 2017-08-01 LAB — THYROID PANEL WITH TSH
FREE THYROXINE INDEX: 2.3 (ref 1.2–4.9)
T3 UPTAKE RATIO: 33 % (ref 24–39)
T4, Total: 6.9 ug/dL (ref 4.5–12.0)
TSH: 3.2 u[IU]/mL (ref 0.450–4.500)

## 2017-08-01 LAB — VITAMIN D 25 HYDROXY (VIT D DEFICIENCY, FRACTURES): VIT D 25 HYDROXY: 51 ng/mL (ref 30.0–100.0)

## 2017-10-04 ENCOUNTER — Ambulatory Visit (HOSPITAL_COMMUNITY)
Admission: EM | Admit: 2017-10-04 | Discharge: 2017-10-04 | Disposition: A | Discharge status: Home / Self Care | Payer: 59 | Attending: Internal Medicine | Admitting: Internal Medicine

## 2017-10-04 ENCOUNTER — Ambulatory Visit (INDEPENDENT_AMBULATORY_CARE_PROVIDER_SITE_OTHER): Payer: 59

## 2017-10-04 ENCOUNTER — Encounter (HOSPITAL_COMMUNITY): Payer: Self-pay | Admitting: Emergency Medicine

## 2017-10-04 ENCOUNTER — Ambulatory Visit (HOSPITAL_COMMUNITY): Payer: 59

## 2017-10-04 DIAGNOSIS — S52501A Unspecified fracture of the lower end of right radius, initial encounter for closed fracture: Secondary | ICD-10-CM

## 2017-10-04 MED ORDER — HYDROCODONE-ACETAMINOPHEN 5-325 MG PO TABS: 1.0000 | ORAL_TABLET | Freq: Four times a day (QID) | ORAL | 0 refills | Status: DC | PRN

## 2017-10-04 MED ORDER — NAPROXEN 500 MG PO TABS
500.0000 mg | ORAL_TABLET | Freq: Two times a day (BID) | ORAL | 0 refills | Status: AC
Start: 2017-10-04 — End: 2017-10-14

## 2017-10-04 NOTE — ED Triage Notes (Signed)
PT C/O: right wrist inj ... Reports she slipped in her driveway.   ONSET: 1630   SX INCLUDE: pain and swelling   DENIES: head inj/LOC  TAKING MEDS: Aleve w/mild relief.   A&O x4... NAD... Ambulatory

## 2017-10-04 NOTE — Progress Notes (Signed)
Orthopedic Tech Progress Note Patient Details:  Erika Parsons [birth year] [id number]  Ortho Devices Type of Ortho Device: Sugartong splint, Arm sling Ortho Device/Splint Location: Right Ortho Device/Splint Interventions: Application   Kristopher Oppenheim 10/04/2017, 8:19 PM

## 2017-10-04 NOTE — ED Notes (Signed)
Provided the patient with an ice pack

## 2017-10-04 NOTE — ED Provider Notes (Signed)
[place]    CSN: [id number] Arrival date & time: 10/04/17  1717     History   Chief Complaint Chief Complaint  Patient presents with  . Wrist Pain    HPI Erika Parsons is a 56 y.o. female.   56 year old female comes in with right wrist pain after falling today. States she slipped in her driveway. She is unable to move her wrist. Took naproxen with some relief. She has swelling and bruising around the radial side of the wrist. Denies head injury, loss of consciousness. Denies numbness/tingling.       Past Medical History:  Diagnosis Date  . Breast cancer [facility]) 2008   bilateral mastectomy  . Breast cancer ([place]) 02/2007   Right breast 2.8 cm + micromets in sentinel node  . 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 GERD (gastroesophageal reflux disease)    hiatal hernia  . Simple endometrial hyperplasia 07/15/12   on bx 2013, done for PMB (with three months with tamox)    Patient Active Problem List   Diagnosis Date Noted  . Breast cancer, Right. T2, N49mic. Mastectomy 04/23/2007. 05/02/2012    Past Surgical History:  Procedure Laterality Date  . ADENOIDECTOMY  1968  . bilateral breast reconstruction  2008  . bilateral mastectomy  04/2007   reconstruction, tamoxifen no SSRI while on it no rt or chemo. Started tamoxifen 7/08- quite June 2013    OB History    Gravida Para Term Preterm AB Living   0 0 0 0 0 0   SAB TAB Ectopic Multiple Live Births   0 0 0 0         Home Medications    Prior to Admission medications   Medication Sig Start Date End Date Taking? Authorizing Provider  Ascorbic Acid (VITAMIN C) 1000 MG tablet Take 1,000 mg by mouth daily.     Yes [provider]  B Complex-Biotin-FA (B COMPLEX 100 TR PO) Take by mouth daily.     Yes [provider]  calcium carbonate (TUMS - DOSED IN MG ELEMENTAL CALCIUM) 500 MG chewable tablet Chew 1 tablet by mouth daily.   Yes [provider]  Calcium  Carbonate-Vitamin D (CALCIUM 600+D) 600-400 MG-UNIT per tablet Take 1 tablet by mouth daily.     Yes [provider]  Cholecalciferol (VITAMIN D HIGH POTENCY) 1000 UNITS capsule Take 1,000 Units by mouth daily.     Yes [provider]  fish oil-omega-3 fatty acids 1000 MG capsule Take 2 g by mouth daily. Takes 1200 mg daily    Yes [provider]  Iodine, Kelp, (KELP PO) Take by mouth. Takes 250 mg daily    Yes [provider]  Multiple Vitamin (MULTIVITAMIN) tablet Take 1 tablet by mouth daily.     Yes [provider]  Zinc 50 MG CAPS Take by mouth.   Yes [provider]  HYDROcodone-acetaminophen (NORCO/VICODIN) 5-325 MG tablet Take 1 tablet by mouth every 6 (six) hours as needed for severe pain. 10/04/17   Tasia Catchings, Amy V, PA-C  naproxen (NAPROSYN) 500 MG tablet Take 1 tablet (500 mg total) by mouth 2 (two) times daily for 10 days. 10/04/17 10/14/17  Ok Edwards, PA-C    Family History Family History  Adopted: Yes  Problem Relation Age of Onset  . Broken bones Mother 56       broken pelvis    Social History Social History   Tobacco  Use  . Smoking status: Never Smoker  . Smokeless tobacco: Never Used  Substance Use Topics  . Alcohol use: Yes    Alcohol/week: 3.0 oz    Types: 5 Glasses of wine per week    Comment: 4-5 glasses of wine a week or a bloody mary  . Drug use: No     Allergies   Prednisone   Review of Systems Review of Systems  Reason unable to perform ROS: See HPI as above.     Physical Exam Triage Vital Signs ED Triage Vitals  Enc Vitals Group     BP 10/04/17 1727 131/82     Pulse Rate 10/04/17 1727 79     Resp 10/04/17 1727 (!) 22     Temp 10/04/17 1727 98.4 F (36.9 C)     Temp Source 10/04/17 1727 Oral     SpO2 10/04/17 1727 100 %     Weight --      Height --      Head Circumference --      Peak Flow --      Pain Score 10/04/17 1728 8     Pain Loc --      Pain Edu? --      Excl. in [place]? --    No  data found.  Updated Vital Signs BP 131/82 (BP Location: Left Arm)   Pulse 79   Temp 98.4 F (36.9 C) (Oral)   Resp (!) 22   LMP 10/12/2009 (Approximate)   SpO2 100%   Physical Exam  Constitutional: She is oriented to person, place, and time. She appears well-developed and well-nourished. No distress.  HENT:  Head: Normocephalic and atraumatic.  Eyes: Conjunctivae are normal. Pupils are equal, round, and reactive to light.  Musculoskeletal:  Swelling and contusion of the right radial wrist area. Deferred strength/ROM due to xray results. Sensation intact. Radial pulse 2+. Cap refill <2s  Neurological: She is alert and oriented to person, place, and time.     UC Treatments / Results  Labs (all labs ordered are listed, but only abnormal results are displayed) Labs Reviewed - No data to display  EKG  EKG Interpretation None       Radiology Dg Wrist Complete Right  Result Date: 10/04/2017 CLINICAL DATA:  56 year old female status post fall with right wrist injury. EXAM: RIGHT WRIST - COMPLETE 3+ VIEW COMPARISON:  None. FINDINGS: There is a horizontal fracture involving the distal radial metaphysis with very minimal displacement/ angulation of the fracture fragments. This does not involve the articular surface. There is surrounding soft tissue swelling. No other osseous or soft tissue abnormality noted. IMPRESSION: Distal radial metaphyseal fracture with very minimal displacement/ angulation of the fracture fragments. Electronically Signed   By: Kristopher Oppenheim M.D.   On: 10/04/2017 18:11    Procedures Procedures (including critical care time)  Medications Ordered in UC Medications - No data to display   Initial Impression / Assessment and Plan / UC Course  I have reviewed the triage vital signs and the nursing notes.  Pertinent labs & imaging results that were available during my care of the patient were reviewed by me and considered in my medical decision making (see  chart for details).    Sugar tong splint. Naproxen as directed, norco for break through pain. Ice compress. Patient to follow up with orthopedics for further management. Return precautions given.   Final Clinical Impressions(s) / UC Diagnoses   Final diagnoses:  Closed fracture of distal end of  right radius, unspecified fracture morphology, initial encounter    ED Discharge Orders        Ordered    naproxen (NAPROSYN) 500 MG tablet  2 times daily     10/04/17 1931    HYDROcodone-acetaminophen (NORCO/VICODIN) 5-325 MG tablet  Every 6 hours PRN     10/04/17 1931       Controlled Substance Prescriptions [place] Controlled Substance Registry consulted? Yes, I have consulted the [place] Controlled Substances Registry for this patient, and feel the risk/benefit ratio today is favorable for proceeding with this prescription for a controlled substance.   Ok Edwards, PA-C 10/04/17 2015

## 2017-10-04 NOTE — Discharge Instructions (Signed)
Splint applied. Naproxen as directed. Norco for break through pain. Ice compress for swelling. Please contact orthopedics on Monday for further evaluation and treatment needed. If experiencing tightness around the splint, numbness/tingling/swelling of the fingers, changes in finger color, follow up for reevaluation.

## 2017-11-12 HISTORY — PX: ORIF WRIST FRACTURE: SHX2133

## 2018-01-14 ENCOUNTER — Encounter: Payer: Self-pay | Admitting: Obstetrics and Gynecology

## 2018-05-13 ENCOUNTER — Other Ambulatory Visit: Payer: Self-pay | Admitting: Dermatology

## 2018-08-20 ENCOUNTER — Ambulatory Visit: Payer: 59 | Admitting: Obstetrics and Gynecology

## 2018-08-25 ENCOUNTER — Other Ambulatory Visit: Payer: Self-pay

## 2018-08-25 ENCOUNTER — Ambulatory Visit (INDEPENDENT_AMBULATORY_CARE_PROVIDER_SITE_OTHER): Payer: 59 | Admitting: Obstetrics and Gynecology

## 2018-08-25 ENCOUNTER — Encounter: Payer: Self-pay | Admitting: Obstetrics and Gynecology

## 2018-08-25 ENCOUNTER — Other Ambulatory Visit (HOSPITAL_COMMUNITY)
Admission: RE | Admit: 2018-08-25 | Discharge: 2018-08-25 | Disposition: A | Discharge status: Home / Self Care | Payer: 59 | Source: Ambulatory Visit | Attending: Obstetrics and Gynecology | Admitting: Obstetrics and Gynecology

## 2018-08-25 VITALS — BP 120/82 | HR 72 | Ht 63.75 in | Wt 122.0 lb

## 2018-08-25 DIAGNOSIS — Z01419 Encounter for gynecological examination (general) (routine) without abnormal findings: Secondary | ICD-10-CM | POA: Diagnosis present

## 2018-08-25 NOTE — Progress Notes (Signed)
57 y.o. [place] Married Caucasian female here for annual exam.    No vaginal bleeding or bladder or bowel concerns.   Out of work for 3 months following wrist fracture.   PCP:  None  Patient's last menstrual period was 10/12/2009 (approximate).     Period Cycle (Days): (postmenopausal)     Sexually active: Yes.    The current method of family planning is post menopausal status.    Exercising: No.  Home exercise routine includes aeobics, jogging, and weight training. Smoker:  no  Health Maintenance: Pap:   07-18-16 Neg:Neg HR HPV, 06-03-13 Neg:Neg HR HPV History of abnormal Pap:  Yes, 2009 hx of ASCUS pap--no treatment. MMG: Hx bilateral mastectomy w/reconstruction--hx Rt.breast Ca/BRCA negative--MRI 2008  Colonoscopy:  07-05-11 normal with Dr. Neoma Laming due 2022.  BMD:   never  Result  n/a TDaP: 07-21-17 Gardasil:   no HIV: 07-18-16 NR Hep C: 07-18-16 Neg Screening Labs:     reports that she has never smoked. She has never used smokeless tobacco. She reports that she drinks about 5.0 standard drinks of alcohol per week. She reports that she does not use drugs.  Past Medical History:  Diagnosis Date  . Breast cancer [facility]) 2008   bilateral mastectomy  . Breast cancer ([place]) 02/2007   Right breast 2.8 cm + micromets in sentinel node  . 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 GERD (gastroesophageal reflux disease)    hiatal hernia  . 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 ADENOIDECTOMY  1968  . bilateral breast reconstruction  2008  . bilateral mastectomy  04/2007   reconstruction, tamoxifen no SSRI while on it no rt or chemo. Started tamoxifen 7/08- quite June 2013  . ORIF WRIST FRACTURE Right 2019    Current Outpatient Medications  Medication Sig Dispense Refill  . Ascorbic Acid (VITAMIN C) 1000 MG tablet Take 1,000 mg by mouth daily.      . B  Complex-Biotin-FA (B COMPLEX 100 TR PO) Take by mouth daily.      . calcium carbonate (TUMS - DOSED IN MG ELEMENTAL CALCIUM) 500 MG chewable tablet Chew 1 tablet by mouth daily.    . Calcium Carbonate-Vitamin D (CALCIUM 600+D) 600-400 MG-UNIT per tablet Take 1 tablet by mouth daily.      . Cholecalciferol (VITAMIN D HIGH POTENCY) 1000 UNITS capsule Take 1,000 Units by mouth daily.      . fish oil-omega-3 fatty acids 1000 MG capsule Take 2 g by mouth daily. Takes 1200 mg daily     . Iodine, Kelp, (KELP PO) Take by mouth. Takes 250 mg daily     . Multiple Vitamin (MULTIVITAMIN) tablet Take 1 tablet by mouth daily.      . Zinc 50 MG CAPS Take by mouth.     No current facility-administered medications for this visit.     Family History  Adopted: Yes  Problem Relation Age of Onset  . Broken bones Mother 75       broken pelvis  . Cancer Maternal Grandmother        colon    Review of Systems  Constitutional: Negative.   HENT: Negative.   Eyes: Negative.   Respiratory: Negative.   Cardiovascular: Negative.   Gastrointestinal: Negative.   Endocrine: Negative.   Genitourinary: Negative.   Musculoskeletal: Negative.   Skin: Negative.  Allergic/Immunologic: Negative.   Neurological: Negative.   Hematological: Negative.   Psychiatric/Behavioral: Negative.   All other systems reviewed and are negative.   Exam:   BP 120/82   Pulse 72   Ht 5' 3.75" (1.619 m)   Wt 122 lb (55.3 kg)   LMP 10/12/2009 (Approximate)   SpO2 96%   BMI 21.11 kg/m     General appearance: alert, cooperative and appears stated age Head: Normocephalic, without obvious abnormality, atraumatic Neck: no adenopathy, supple, symmetrical, trachea midline and thyroid normal to inspection and palpation Lungs: clear to auscultation bilaterally Breasts: normal appearance, no masses or tenderness, No nipple retraction or dimpling, No nipple discharge or bleeding, No axillary or supraclavicular adenopathy Heart: regular  rate and rhythm Abdomen: soft, non-tender; no masses, no organomegaly Extremities: extremities normal, atraumatic, no cyanosis or edema Skin: Skin color, texture, turgor normal. No rashes or lesions Lymph nodes: Cervical, supraclavicular, and axillary nodes normal. No abnormal inguinal nodes palpated Neurologic: Grossly normal  Pelvic: External genitalia:  no lesions              Urethra:  normal appearing urethra with no masses, tenderness or lesions              Bartholins and Skenes: normal                 Vagina: normal appearing vagina with normal color and discharge, no lesions              Cervix: no lesions              Pap taken: Yes.   Bimanual Exam:  Uterus:  normal size, contour, position, consistency, mobility, non-tender              Adnexa: no mass, fullness, tenderness              Rectal exam: Yes.  .  Confirms.              Anus:  normal sphincter tone, no lesions  Chaperone was present for exam.  Assessment:   Well woman visit with normal exam. Hx bilateral mastectomy for breast cancer.  BRCA neg.  Hx endometrial hyperplasia.  Wrist fracture last fall.   Remote hx ASCUS pap.  Plan: Mammogram screening not needed.  Recommended self checks of the chest wall and axillary region.  Pap and HR HPV as above. Guidelines for Calcium, Vitamin D, regular exercise program including cardiovascular and weight bearing exercise. BMD declined.  Routine labs.    Follow up annually and prn.   After visit summary provided.

## 2018-08-25 NOTE — Patient Instructions (Signed)

## 2018-08-26 LAB — COMPREHENSIVE METABOLIC PANEL
A/G RATIO: 2.3 — AB (ref 1.2–2.2)
ALBUMIN: 4.8 g/dL (ref 3.5–5.5)
ALT: 18 IU/L (ref 0–32)
AST: 28 IU/L (ref 0–40)
Alkaline Phosphatase: 71 IU/L (ref 39–117)
BUN/Creatinine Ratio: 10 (ref 9–23)
BUN: 8 mg/dL (ref 6–24)
Bilirubin Total: 0.5 mg/dL (ref 0.0–1.2)
CALCIUM: 9.5 mg/dL (ref 8.7–10.2)
CO2: 23 mmol/L (ref 20–29)
CREATININE: 0.83 mg/dL (ref 0.57–1.00)
Chloride: 95 mmol/L — ABNORMAL LOW (ref 96–106)
GFR, EST AFRICAN AMERICAN: 90 mL/min/{1.73_m2} (ref >59)
GFR, EST NON AFRICAN AMERICAN: 78 mL/min/{1.73_m2} (ref >59)
GLOBULIN, TOTAL: 2.1 g/dL (ref 1.5–4.5)
Glucose: 87 mg/dL (ref 65–99)
POTASSIUM: 3.7 mmol/L (ref 3.5–5.2)
SODIUM: 138 mmol/L (ref 134–144)
TOTAL PROTEIN: 6.9 g/dL (ref 6.0–8.5)

## 2018-08-26 LAB — LIPID PANEL
CHOLESTEROL TOTAL: 239 mg/dL — AB (ref 100–199)
Chol/HDL Ratio: 2.2 ratio (ref 0.0–4.4)
HDL: 109 mg/dL (ref >39)
LDL Calculated: 118 mg/dL — ABNORMAL HIGH (ref 0–99)
Triglycerides: 60 mg/dL (ref 0–149)
VLDL CHOLESTEROL CAL: 12 mg/dL (ref 5–40)

## 2018-08-26 LAB — TSH: TSH: 3.07 u[IU]/mL (ref 0.450–4.500)

## 2018-08-26 LAB — CBC
HEMATOCRIT: 39.7 % (ref 34.0–46.6)
HEMOGLOBIN: 13.7 g/dL (ref 11.1–15.9)
MCH: 31.4 pg (ref 26.6–33.0)
MCHC: 34.5 g/dL (ref 31.5–35.7)
MCV: 91 fL (ref 79–97)
Platelets: 241 10*3/uL (ref 150–450)
RBC: 4.36 x10E6/uL (ref 3.77–5.28)
RDW: 12.1 % — AB (ref 12.3–15.4)
WBC: 4.6 10*3/uL (ref 3.4–10.8)

## 2018-08-26 LAB — VITAMIN D 25 HYDROXY (VIT D DEFICIENCY, FRACTURES): Vit D, 25-Hydroxy: 56.1 ng/mL (ref 30.0–100.0)

## 2018-08-27 LAB — CYTOLOGY - PAP
Diagnosis: NEGATIVE
HPV: NOT DETECTED

## 2018-11-12 DIAGNOSIS — M1990 Unspecified osteoarthritis, unspecified site: Secondary | ICD-10-CM

## 2018-11-12 DIAGNOSIS — M419 Scoliosis, unspecified: Secondary | ICD-10-CM

## 2018-11-12 HISTORY — DX: Scoliosis, unspecified: M41.9

## 2018-11-12 HISTORY — DX: Unspecified osteoarthritis, unspecified site: M19.90

## 2019-02-11 HISTORY — PX: ORIF WRIST FRACTURE: SHX2133

## 2019-02-11 HISTORY — PX: OTHER SURGICAL HISTORY: SHX169

## 2019-09-02 ENCOUNTER — Ambulatory Visit: Payer: 59 | Admitting: Obstetrics and Gynecology

## 2019-10-16 ENCOUNTER — Other Ambulatory Visit: Payer: Self-pay

## 2019-10-20 ENCOUNTER — Other Ambulatory Visit: Payer: Self-pay

## 2019-10-20 ENCOUNTER — Ambulatory Visit (INDEPENDENT_AMBULATORY_CARE_PROVIDER_SITE_OTHER): Payer: 59 | Admitting: Obstetrics and Gynecology

## 2019-10-20 ENCOUNTER — Encounter: Payer: Self-pay | Admitting: Obstetrics and Gynecology

## 2019-10-20 VITALS — BP 122/78 | HR 70 | Temp 97.0°F | Resp 16 | Ht 64.0 in | Wt 131.4 lb

## 2019-10-20 DIAGNOSIS — Z01419 Encounter for gynecological examination (general) (routine) without abnormal findings: Secondary | ICD-10-CM | POA: Diagnosis not present

## 2019-10-20 NOTE — Progress Notes (Signed)
58 y.o. G52P0000 Married Caucasian female here for annual exam.    Broke her right foot and her left wrist after a fall in the bathroom.   Takes Chia seeds to help control her cholesterol.  Caring for her 60 year old mother.  Working from home.   PCP:   None  Patient's last menstrual period was 10/12/2009 (approximate).           Sexually active: Yes.    The current method of family planning is post menopausal status.    Exercising: Yes.    walking, jogging and some weights Smoker:  no  Health Maintenance: Pap: 08-25-18 Neg:Neg HR HPV,07-18-16 Neg:Neg HR HPV, 06-03-13 Neg:Neg HR HPV History of abnormal Pap:  Yes, 2009 hx of ASCUS pap--no treatment. MMG: Hx bilateral mastectomy w/reconstruction--hx Rt.breast Ca/BRCA negative--MRI 2008 Colonoscopy: 07-05-11 normal;next due 2022 BMD:   n/a  Result  n/a TDaP:  07-21-17 Gardasil:   n/a HIV:07-18-16 NR Hep C:07-18-16 Neg Screening Labs:     reports that she has never smoked. She has never used smokeless tobacco. She reports current alcohol use of about 5.0 standard drinks of alcohol per week. She reports that she does not use drugs.  Past Medical History:  Diagnosis Date  . 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 Breast cancer [facility]) 2008   bilateral mastectomy  . Breast cancer ([place]) 02/2007   Right breast 2.8 cm + micromets in sentinel node  . 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 GERD (gastroesophageal reflux disease)    hiatal hernia  . Scoliosis 2020   patient saw chiropractor  . 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 ADENOIDECTOMY  1968  . bilateral breast reconstruction  2008  . bilateral mastectomy  04/2007   reconstruction, tamoxifen no SSRI while on it no rt or chemo. Started tamoxifen 7/08- quite June 2013  . broken foot Right 02/2019  . ORIF WRIST FRACTURE Right 2019  . ORIF WRIST FRACTURE Left 02/2019     Current Outpatient Medications  Medication Sig Dispense Refill  . Ascorbic Acid (VITAMIN C) 1000 MG tablet Take 1,000 mg by mouth daily.      . B Complex-Biotin-FA (B COMPLEX 100 TR PO) Take by mouth daily.      . Calcium Carbonate-Vitamin D (CALCIUM 600+D) 600-400 MG-UNIT per tablet Take 1 tablet by mouth daily.      . Cholecalciferol (VITAMIN D HIGH POTENCY) 1000 UNITS capsule Take 1,000 Units by mouth daily.      . fish oil-omega-3 fatty acids 1000 MG capsule Take 2 g by mouth daily. Takes 1200 mg daily     . Glucosamine-Chondroitin 1500-1200 MG/30ML LIQD Take 4 tablets by mouth daily.    . Iodine, Kelp, (KELP PO) Take by mouth. Takes 250 mg daily     . Multiple Vitamin (MULTIVITAMIN) tablet Take 1 tablet by mouth daily.      . naproxen sodium (ALEVE) 220 MG tablet Take 220 mg by mouth daily as needed.    . Zinc 50 MG CAPS Take by mouth.     No current facility-administered medications for this visit.     Family History  Adopted: Yes  Problem Relation Age of Onset  . Broken bones Mother 72       broken pelvis  . Cancer Maternal Grandmother  colon    Review of Systems  All other systems reviewed and are negative.   Exam:   BP 122/78   Pulse 70   Temp (!) 97 F (36.1 C) (Temporal)   Resp 16   Ht 5' 4"  (1.626 m)   Wt 131 lb 6.4 oz (59.6 kg)   LMP 10/12/2009 (Approximate)   BMI 22.55 kg/m     General appearance: alert, cooperative and appears stated age Head: normocephalic, without obvious abnormality, atraumatic Neck: no adenopathy, supple, symmetrical, trachea midline and thyroid normal to inspection and palpation Lungs: clear to auscultation bilaterally Breasts: absent.  Consistent with reconstruction.  No masses, no axillary adenopathy.  Heart: regular rate and rhythm Abdomen: soft, non-tender; no masses, no organomegaly Extremities: extremities normal, atraumatic, no cyanosis or edema Skin: skin color, texture, turgor normal. No rashes or lesions Lymph  nodes: cervical, supraclavicular, and axillary nodes normal. Neurologic: grossly normal  Pelvic: External genitalia:  no lesions              No abnormal inguinal nodes palpated.              Urethra:  normal appearing urethra with no masses, tenderness or lesions              Bartholins and Skenes: normal                 Vagina: normal appearing vagina with normal color and discharge, no lesions              Cervix: no lesions              Pap taken: No. Bimanual Exam:  Uterus:  normal size, contour, position, consistency, mobility, non-tender              Adnexa: no mass, fullness, tenderness              Rectal exam: Yes.  .  Confirms.              Anus:  normal sphincter tone, no lesions  Chaperone was present for exam.  Assessment:   Well woman visit with normal exam. Hx bilateral mastectomy for breast cancer. BRCA neg.  Hx endometrial hyperplasia. Hx fractures.  Remote hx ASCUS pap.  Plan: Mammogram screening discussed. Self breast awareness reviewed. Pap and HR HPV 2024. Guidelines for Calcium, Vitamin D, regular exercise program including cardiovascular and weight bearing exercise. She declines BMD study.  Routine labs.  Follow up annually and prn.   After visit summary provided.

## 2019-10-20 NOTE — Patient Instructions (Signed)

## 2019-10-21 LAB — COMPREHENSIVE METABOLIC PANEL
ALT: 14 IU/L (ref 0–32)
AST: 22 IU/L (ref 0–40)
Albumin/Globulin Ratio: 2 (ref 1.2–2.2)
Albumin: 4.3 g/dL (ref 3.8–4.9)
Alkaline Phosphatase: 74 IU/L (ref 39–117)
BUN/Creatinine Ratio: 15 (ref 9–23)
BUN: 11 mg/dL (ref 6–24)
Bilirubin Total: 0.3 mg/dL (ref 0.0–1.2)
CO2: 26 mmol/L (ref 20–29)
Calcium: 9.4 mg/dL (ref 8.7–10.2)
Chloride: 99 mmol/L (ref 96–106)
Creatinine, Ser: 0.73 mg/dL (ref 0.57–1.00)
GFR calc Af Amer: 104 mL/min/{1.73_m2} (ref >59)
GFR calc non Af Amer: 90 mL/min/{1.73_m2} (ref >59)
Globulin, Total: 2.2 g/dL (ref 1.5–4.5)
Glucose: 97 mg/dL (ref 65–99)
Potassium: 4.2 mmol/L (ref 3.5–5.2)
Sodium: 135 mmol/L (ref 134–144)
Total Protein: 6.5 g/dL (ref 6.0–8.5)

## 2019-10-21 LAB — LIPID PANEL
Chol/HDL Ratio: 2.2 ratio (ref 0.0–4.4)
Cholesterol, Total: 226 mg/dL — ABNORMAL HIGH (ref 100–199)
HDL: 104 mg/dL (ref >39)
LDL Chol Calc (NIH): 107 mg/dL — ABNORMAL HIGH (ref 0–99)
Triglycerides: 87 mg/dL (ref 0–149)
VLDL Cholesterol Cal: 15 mg/dL (ref 5–40)

## 2019-10-21 LAB — CBC
Hematocrit: 37.6 % (ref 34.0–46.6)
Hemoglobin: 13.5 g/dL (ref 11.1–15.9)
MCH: 33.8 pg — ABNORMAL HIGH (ref 26.6–33.0)
MCHC: 35.9 g/dL — ABNORMAL HIGH (ref 31.5–35.7)
MCV: 94 fL (ref 79–97)
Platelets: 254 10*3/uL (ref 150–450)
RBC: 4 x10E6/uL (ref 3.77–5.28)
RDW: 12 % (ref 11.7–15.4)
WBC: 4.9 10*3/uL (ref 3.4–10.8)

## 2019-10-21 LAB — VITAMIN D 25 HYDROXY (VIT D DEFICIENCY, FRACTURES): Vit D, 25-Hydroxy: 40.8 ng/mL (ref 30.0–100.0)

## 2020-10-24 ENCOUNTER — Encounter: Payer: Self-pay | Admitting: Obstetrics and Gynecology

## 2020-10-24 ENCOUNTER — Ambulatory Visit (INDEPENDENT_AMBULATORY_CARE_PROVIDER_SITE_OTHER): Payer: BC Managed Care – PPO | Admitting: Obstetrics and Gynecology

## 2020-10-24 ENCOUNTER — Other Ambulatory Visit: Payer: Self-pay

## 2020-10-24 VITALS — BP 122/78 | HR 74 | Ht 63.5 in | Wt 127.0 lb

## 2020-10-24 DIAGNOSIS — Z01419 Encounter for gynecological examination (general) (routine) without abnormal findings: Secondary | ICD-10-CM | POA: Diagnosis not present

## 2020-10-24 NOTE — Progress Notes (Signed)
59 y.o. G46P0000 Married Caucasian female here for annual exam.    Denies vaginal bleeding and discharge.   Patient would like labs today.  Mother had a slight stroke, and she is doing well.   Had her Covid booster, shingles vaccine, and flu vaccine.   PCP:  None  Patient's last menstrual period was 10/12/2009 (approximate).           Sexually active: Yes.    The current method of family planning is post menopausal status.    Exercising: Yes.    walking, running Smoker:  no  Health Maintenance: Pap: 08-25-18 Neg:Neg HR HPV,07-18-16 Neg:Neg HR HPV, 06-03-13 Neg:Neg HR HPV History of abnormal Pap:  Yes, 2009 hx of ASCUS pap--no treatment. MMG:  Hx Br.Ca--Bil.Mastectomy Colonoscopy: 07-05-11 normal;next due 2022 BMD: n/a  Result  n/a TDaP: 07-21-17 Gardasil:   no HIV: 07-18-16 NR Hep C: 07-18-16 Neg Screening Labs:  Today.    reports that she has never smoked. She has never used smokeless tobacco. She reports current alcohol use of about 5.0 standard drinks of alcohol per week. She reports that she does not use drugs.  Past Medical History:  Diagnosis Date  . 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 Breast cancer [facility]) 2008   bilateral mastectomy  . Breast cancer ([place]) 02/2007   Right breast 2.8 cm + micromets in sentinel node  . 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 GERD (gastroesophageal reflux disease)    hiatal hernia  . Scoliosis 2020   patient saw chiropractor  . 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 ADENOIDECTOMY  1968  . bilateral breast reconstruction  2008  . bilateral mastectomy  04/2007   reconstruction, tamoxifen no SSRI while on it no rt or chemo. Started tamoxifen 7/08- quite June 2013  . broken foot Right 02/2019  . ORIF WRIST FRACTURE Right 2019  . ORIF WRIST FRACTURE Left 02/2019    Current Outpatient Medications  Medication Sig Dispense  Refill  . Ascorbic Acid (VITAMIN C) 1000 MG tablet Take 1,000 mg by mouth daily.      . B Complex-Biotin-FA (B COMPLEX 100 TR PO) Take by mouth daily.      . Calcium Carbonate-Vitamin D (CALCIUM 600+D) 600-400 MG-UNIT per tablet Take 1 tablet by mouth daily.      . Cholecalciferol (VITAMIN D HIGH POTENCY) 1000 UNITS capsule Take 1,000 Units by mouth daily.      . fish oil-omega-3 fatty acids 1000 MG capsule Take 2 g by mouth daily. Takes 1200 mg daily     . Glucosamine-Chondroitin 1500-1200 MG/30ML LIQD Take 4 tablets by mouth daily.    Marland Kitchen ibuprofen (ADVIL) 400 MG tablet Take 400 mg by mouth every 6 (six) hours as needed.    . Iodine, Kelp, (KELP PO) Take by mouth. Takes 250 mg daily     . Multiple Vitamin (MULTIVITAMIN) tablet Take 1 tablet by mouth daily.      . Zinc 50 MG CAPS Take by mouth.     No current facility-administered medications for this visit.    Family History  Adopted: Yes  Problem Relation Age of Onset  . Broken bones Mother 62       broken pelvis  . Stroke Mother   . Cancer Maternal Grandmother        colon  Review of Systems  All other systems reviewed and are negative.   Exam:   BP 122/78   Pulse 74   Ht 5' 3.5" (1.613 m)   Wt 127 lb (57.6 kg)   LMP 10/12/2009 (Approximate)   SpO2 99%   BMI 22.14 kg/m     General appearance: alert, cooperative and appears stated age Head: normocephalic, without obvious abnormality, atraumatic Neck: no adenopathy, supple, symmetrical, trachea midline and thyroid normal to inspection and palpation Lungs: clear to auscultation bilaterally Breasts: absent.  Has reconstruction.  No nipple discharge or bleeding, No axillary adenopathy Heart: regular rate and rhythm Abdomen: soft, non-tender; no masses, no organomegaly Extremities: extremities normal, atraumatic, no cyanosis or edema Skin: skin color, texture, turgor normal. No rashes or lesions Lymph nodes: cervical, supraclavicular, and axillary nodes  normal. Neurologic: grossly normal  Pelvic: External genitalia:  no lesions              No abnormal inguinal nodes palpated.              Urethra:  normal appearing urethra with no masses, tenderness or lesions              Bartholins and Skenes: normal                 Vagina: normal appearing vagina with normal color and discharge, no lesions              Cervix: no lesions              Pap taken: No. Bimanual Exam:  Uterus:  normal size, contour, position, consistency, mobility, non-tender              Adnexa: no mass, fullness, tenderness              Rectal exam: Yes.  .  Confirms.              Anus:  normal sphincter tone, no lesions  Chaperone was present for exam.  Assessment:   Well woman visit with normal exam. Hx bilateral mastectomy for breast cancer. BRCA neg.  Hx endometrial hyperplasia. Hx fractures.  Remote hx ASCUS pap.  Plan: Mammogram screening discussed. Self breast awareness reviewed. Pap and HR HPV 2024 Guidelines for Calcium, Vitamin D, regular exercise program including cardiovascular and weight bearing exercise. Routine labs today.  She declines bone density at this time. Follow up annually and prn.

## 2020-10-24 NOTE — Patient Instructions (Signed)

## 2020-10-25 LAB — LIPID PANEL
Chol/HDL Ratio: 2.2 ratio (ref 0.0–4.4)
Cholesterol, Total: 240 mg/dL — ABNORMAL HIGH (ref 100–199)
HDL: 110 mg/dL (ref >39)
LDL Chol Calc (NIH): 119 mg/dL — ABNORMAL HIGH (ref 0–99)
Triglycerides: 67 mg/dL (ref 0–149)
VLDL Cholesterol Cal: 11 mg/dL (ref 5–40)

## 2020-10-25 LAB — CBC
Hematocrit: 38.4 % (ref 34.0–46.6)
Hemoglobin: 13.5 g/dL (ref 11.1–15.9)
MCH: 32.3 pg (ref 26.6–33.0)
MCHC: 35.2 g/dL (ref 31.5–35.7)
MCV: 92 fL (ref 79–97)
Platelets: 274 10*3/uL (ref 150–450)
RBC: 4.18 x10E6/uL (ref 3.77–5.28)
RDW: 11.7 % (ref 11.7–15.4)
WBC: 5.2 10*3/uL (ref 3.4–10.8)

## 2020-10-25 LAB — COMPREHENSIVE METABOLIC PANEL
ALT: 19 IU/L (ref 0–32)
AST: 25 IU/L (ref 0–40)
Albumin/Globulin Ratio: 2.3 — ABNORMAL HIGH (ref 1.2–2.2)
Albumin: 4.6 g/dL (ref 3.8–4.9)
Alkaline Phosphatase: 74 IU/L (ref 44–121)
BUN/Creatinine Ratio: 13 (ref 12–28)
BUN: 11 mg/dL (ref 8–27)
Bilirubin Total: 0.3 mg/dL (ref 0.0–1.2)
CO2: 24 mmol/L (ref 20–29)
Calcium: 9.5 mg/dL (ref 8.7–10.3)
Chloride: 97 mmol/L (ref 96–106)
Creatinine, Ser: 0.85 mg/dL (ref 0.57–1.00)
GFR calc Af Amer: 86 mL/min/{1.73_m2} (ref >59)
GFR calc non Af Amer: 75 mL/min/{1.73_m2} (ref >59)
Globulin, Total: 2 g/dL (ref 1.5–4.5)
Glucose: 99 mg/dL (ref 65–99)
Potassium: 4 mmol/L (ref 3.5–5.2)
Sodium: 134 mmol/L (ref 134–144)
Total Protein: 6.6 g/dL (ref 6.0–8.5)

## 2020-10-25 LAB — VITAMIN D 25 HYDROXY (VIT D DEFICIENCY, FRACTURES): Vit D, 25-Hydroxy: 78.6 ng/mL (ref 30.0–100.0)

## 2020-10-25 LAB — TSH: TSH: 3.51 u[IU]/mL (ref 0.450–4.500)

## 2020-11-03 DIAGNOSIS — M79641 Pain in right hand: Secondary | ICD-10-CM | POA: Diagnosis not present

## 2020-11-03 DIAGNOSIS — S60051A Contusion of right little finger without damage to nail, initial encounter: Secondary | ICD-10-CM | POA: Diagnosis not present

## 2020-11-03 DIAGNOSIS — S62616A Displaced fracture of proximal phalanx of right little finger, initial encounter for closed fracture: Secondary | ICD-10-CM | POA: Diagnosis not present

## 2020-11-03 DIAGNOSIS — W11XXXA Fall on and from ladder, initial encounter: Secondary | ICD-10-CM | POA: Diagnosis not present

## 2020-11-14 DIAGNOSIS — S62616A Displaced fracture of proximal phalanx of right little finger, initial encounter for closed fracture: Secondary | ICD-10-CM | POA: Diagnosis not present

## 2020-11-14 DIAGNOSIS — M79641 Pain in right hand: Secondary | ICD-10-CM | POA: Diagnosis not present

## 2020-12-05 DIAGNOSIS — S62616A Displaced fracture of proximal phalanx of right little finger, initial encounter for closed fracture: Secondary | ICD-10-CM | POA: Diagnosis not present

## 2021-06-23 ENCOUNTER — Encounter: Payer: Self-pay | Admitting: Gastroenterology

## 2021-07-06 ENCOUNTER — Encounter: Payer: Self-pay | Admitting: Gastroenterology

## 2021-08-31 ENCOUNTER — Ambulatory Visit (AMBULATORY_SURGERY_CENTER): Payer: Self-pay | Admitting: *Deleted

## 2021-08-31 ENCOUNTER — Other Ambulatory Visit: Payer: Self-pay

## 2021-08-31 VITALS — Ht 64.0 in | Wt 126.0 lb

## 2021-08-31 DIAGNOSIS — Z1211 Encounter for screening for malignant neoplasm of colon: Secondary | ICD-10-CM

## 2021-08-31 MED ORDER — PLENVU 140 G PO SOLR: 1.0000 | Freq: Once | ORAL | 0 refills | Status: AC

## 2021-08-31 NOTE — Progress Notes (Signed)

## 2021-09-08 ENCOUNTER — Encounter: Payer: Self-pay | Admitting: Gastroenterology

## 2021-09-14 ENCOUNTER — Encounter: Payer: Self-pay | Admitting: Gastroenterology

## 2021-09-14 ENCOUNTER — Other Ambulatory Visit: Payer: Self-pay

## 2021-09-14 ENCOUNTER — Ambulatory Visit (AMBULATORY_SURGERY_CENTER): Payer: BC Managed Care – PPO | Admitting: Gastroenterology

## 2021-09-14 VITALS — BP 157/90 | HR 72 | Temp 97.2°F | Resp 14 | Ht 63.5 in | Wt 125.0 lb

## 2021-09-14 DIAGNOSIS — Z1211 Encounter for screening for malignant neoplasm of colon: Secondary | ICD-10-CM | POA: Diagnosis not present

## 2021-09-14 DIAGNOSIS — T184XXA Foreign body in colon, initial encounter: Secondary | ICD-10-CM

## 2021-09-14 DIAGNOSIS — T189XXA Foreign body of alimentary tract, part unspecified, initial encounter: Secondary | ICD-10-CM

## 2021-09-14 MED ORDER — SODIUM CHLORIDE 0.9 % IV SOLN: 500.0000 mL | Freq: Once | INTRAVENOUS | Status: DC

## 2021-09-14 NOTE — Progress Notes (Signed)
History & Physical  Primary Care Physician:  Nunzio Cobbs, MD Primary Gastroenterologist: Lucio Edward, MD  CHIEF COMPLAINT:  CRC screening   HPI: Erika Parsons is a 60 y.o. female is here today for CRC screening, average risk.     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   Breast cancer [facility]) 2008   bilateral mastectomy   Breast cancer ([place]) 02/2007   Right breast 2.8 cm + micromets in sentinel node   Breast fibroadenoma 2004   5 cm Right breast   GERD (gastroesophageal reflux disease)    hiatal hernia   Post-operative nausea and vomiting    Scoliosis 2020   patient saw chiropractor   Simple endometrial hyperplasia 07/15/2012   on bx 2013, done for PMB (with three months with tamox)    Past Surgical History:  Procedure Laterality Date   ADENOIDECTOMY  1968   bilateral breast reconstruction  2008   bilateral mastectomy  04/2007   reconstruction, tamoxifen no SSRI while on it no rt or chemo. Started tamoxifen 7/08- quite June 2013   broken foot Right 02/2019   COLONOSCOPY     ORIF WRIST FRACTURE Right 2019   ORIF WRIST FRACTURE Left 02/2019    Prior to Admission medications   Medication Sig Start Date End Date Taking? Authorizing Provider  Ascorbic Acid (VITAMIN C) 1000 MG tablet Take 1,000 mg by mouth daily.   Yes [provider]  B Complex-Biotin-FA (B COMPLEX 100 TR PO) Take by mouth daily.   Yes [provider]  BIOTIN PO Take by mouth.   Yes [provider]  Calcium Carbonate-Vitamin D 600-400 MG-UNIT tablet Take 1 tablet by mouth daily.   Yes [provider]  Cholecalciferol 25 MCG (1000 UT) capsule Take 1,000 Units by mouth daily.   Yes [provider]  fish oil-omega-3 fatty acids 1000 MG capsule Take 2 g by mouth daily. Takes 1200 mg daily   Yes [provider]  Glucosamine-Chondroitin 1500-1200 MG/30ML LIQD Take 4 tablets by mouth daily. 09/13/19  Yes [provider]  Iodine, Kelp, (KELP PO) Take by mouth. Takes 250 mg daily   Yes [provider]  Multiple Vitamin (MULTIVITAMIN) tablet Take 1 tablet by mouth daily.   Yes [provider]  Zinc 50 MG CAPS Take by mouth.   Yes [provider]  ibuprofen (ADVIL) 400 MG tablet Take 400 mg by mouth every 6 (six) hours as needed. Patient not taking: Reported on 09/14/2021    [provider]    Current Outpatient Medications  Medication Sig Dispense Refill   Ascorbic Acid (VITAMIN C) 1000 MG tablet Take 1,000 mg by mouth daily.     B Complex-Biotin-FA (B COMPLEX 100 TR PO) Take by mouth daily.     BIOTIN PO Take by mouth.     Calcium Carbonate-Vitamin D 600-400 MG-UNIT tablet Take 1 tablet by mouth daily.     Cholecalciferol 25 MCG (1000 UT) capsule Take 1,000 Units by mouth daily.     fish oil-omega-3 fatty acids 1000 MG capsule Take 2 g by mouth daily. Takes 1200 mg daily     Glucosamine-Chondroitin 1500-1200 MG/30ML LIQD Take 4 tablets by mouth daily.     Iodine, Kelp, (KELP PO) Take by mouth. Takes 250 mg daily     Multiple Vitamin (MULTIVITAMIN) tablet Take 1 tablet by mouth daily.     Zinc 50 MG CAPS Take  by mouth.     ibuprofen (ADVIL) 400 MG tablet Take 400 mg by mouth every 6 (six) hours as needed. (Patient not taking: Reported on 09/14/2021)     Current Facility-Administered Medications  Medication Dose Route Frequency Provider Last Rate Last Admin   0.9 %  sodium chloride infusion  500 mL Intravenous Once Ladene Artist, MD        Allergies as of 09/14/2021   (No Known Allergies)    Family History  Adopted: Yes  Problem Relation Age of Onset   Broken bones Mother 56       broken pelvis   Stroke Mother    Cancer Maternal Grandmother        colon   Colon cancer Maternal Grandfather    Rectal cancer Neg Hx    Stomach cancer Neg Hx     Social History   Socioeconomic History   Marital status: Married    Spouse name: Not on file    Number of children: Not on file   Years of education: Not on file   Highest education level: Not on file  Occupational History   Not on file  Tobacco Use   Smoking status: Never   Smokeless tobacco: Never  Vaping Use   Vaping Use: Never used  Substance and Sexual Activity   Alcohol use: Yes    Alcohol/week: 5.0 standard drinks    Types: 5 Glasses of wine per week    Comment: 4-5 glasses of wine a week or a bloody mary   Drug use: No   Sexual activity: Yes    Partners: Male    Birth control/protection: Post-menopausal  Other Topics Concern   Not on file  Social History Narrative   Not on file   Social Determinants of Health   Financial Resource Strain: Not on file  Food Insecurity: Not on file  Transportation Needs: Not on file  Physical Activity: Not on file  Stress: Not on file  Social Connections: Not on file  Intimate Partner Violence: Not on file    Review of Systems:  All systems reviewed an negative except where noted in HPI.  Gen: Denies any fever, chills, sweats, anorexia, fatigue, weakness, malaise, weight loss, and sleep disorder CV: Denies chest pain, angina, palpitations, syncope, orthopnea, PND, peripheral edema, and claudication. Resp: Denies dyspnea at rest, dyspnea with exercise, cough, sputum, wheezing, coughing up blood, and pleurisy. GI: Denies vomiting blood, jaundice, and fecal incontinence.   Denies dysphagia or odynophagia. GU : Denies urinary burning, blood in urine, urinary frequency, urinary hesitancy, nocturnal urination, and urinary incontinence. MS: Denies joint pain, limitation of movement, and swelling, stiffness, low back pain, extremity pain. Denies muscle weakness, cramps, atrophy.  Derm: Denies rash, itching, dry skin, hives, moles, warts, or unhealing ulcers.  Psych: Denies depression, anxiety, memory loss, suicidal ideation, hallucinations, paranoia, and confusion. Heme: Denies bruising, bleeding, and enlarged lymph nodes. Neuro:   Denies any headaches, dizziness, paresthesias. Endo:  Denies any problems with DM, thyroid, adrenal function.   Physical Exam: General:  Alert, well-developed, in NAD Head:  Normocephalic and atraumatic. Eyes:  Sclera clear, no icterus.   Conjunctiva pink. Ears:  Normal auditory acuity. Mouth:  No deformity or lesions.  Neck:  Supple; no masses . Lungs:  Clear throughout to auscultation.   No wheezes, crackles, or rhonchi. No acute distress. Heart:  Regular rate and rhythm; no murmurs. Abdomen:  Soft, nondistended, nontender. No masses, hepatomegaly. No obvious masses.  Normal bowel .  Rectal:  Deferred   Msk:  Symmetrical without gross deformities.. Pulses:  Normal pulses noted. Extremities:  Without edema. Neurologic:  Alert and  oriented x4;  grossly normal neurologically. Skin:  Intact without significant lesions or rashes. Cervical Nodes:  No significant cervical adenopathy. Psych:  Alert and cooperative. Normal mood and affect.   Impression / Plan:   CRC screening, average risk for colonoscopy today.    Pricilla Riffle. Fuller Plan  09/14/2021, 10:05 AM See Shea Evans, [facility], to contact our on call provider

## 2021-09-14 NOTE — Progress Notes (Signed)
Pt's states no medical or surgical changes since previsit or office visit. VS assessed by D.T 

## 2021-09-14 NOTE — Patient Instructions (Addendum)
Handout given on diverticulosis and high fiber diet  YOU HAD AN ENDOSCOPIC PROCEDURE TODAY AT [facility]:   Refer to the procedure report that was given to you for any specific questions about what was found during the examination.  If the procedure report does not answer your questions, please call your gastroenterologist to clarify.  If you requested that your care partner not be given the details of your procedure findings, then the procedure report has been included in a sealed envelope for you to review at your convenience later.  YOU SHOULD EXPECT: Some feelings of bloating in the abdomen. Passage of more gas than usual.  Walking can help get rid of the air that was put into your GI tract during the procedure and reduce the bloating. If you had a lower endoscopy (such as a colonoscopy or flexible sigmoidoscopy) you may notice spotting of blood in your stool or on the toilet paper. If you underwent a bowel prep for your procedure, you may not have a normal bowel movement for a few days.  Please Note:  You might notice some irritation and congestion in your nose or some drainage.  This is from the oxygen used during your procedure.  There is no need for concern and it should clear up in a day or so.  SYMPTOMS TO REPORT IMMEDIATELY:  Following lower endoscopy (colonoscopy or flexible sigmoidoscopy):  Excessive amounts of blood in the stool  Significant tenderness or worsening of abdominal pains  Swelling of the abdomen that is new, acute  Fever of 100F or higher  For urgent or emergent issues, a gastroenterologist can be reached at any hour by calling [phone]. Do not use MyChart messaging for urgent concerns.    DIET:  We do recommend a small meal at first, but then you may proceed to your regular diet.  Drink plenty of fluids but you should avoid alcoholic beverages for 24 hours.  ACTIVITY:  You should plan to take it easy for the rest of today and you should NOT  DRIVE or use heavy machinery until tomorrow (because of the sedation medicines used during the test).    FOLLOW UP: Our staff will call the number listed on your records 48-72 hours following your procedure to check on you and address any questions or concerns that you may have regarding the information given to you following your procedure. If we do not reach you, we will leave a message.  We will attempt to reach you two times.  During this call, we will ask if you have developed any symptoms of COVID 19. If you develop any symptoms (ie: fever, flu-like symptoms, shortness of breath, cough etc.) before then, please call [phone].  If you test positive for Covid 19 in the 2 weeks post procedure, please call and report this information to [place].    If any biopsies were taken you will be contacted by phone or by letter within the next 1-3 weeks.  Please call us at [phone] if you have not heard about the biopsies in 3 weeks.    SIGNATURES/CONFIDENTIALITY: You and/or your care partner have signed paperwork which will be entered into your electronic medical record.  These signatures attest to the fact that that the information above on your After Visit Summary has been reviewed and is understood.  Full responsibility of the confidentiality of this discharge information lies with you and/or your care-partner.

## 2021-09-14 NOTE — Op Note (Signed)
[facility] Patient Name: Erika Parsons Procedure Date: 09/14/2021 9:51 AM MRN: [id number] Endoscopist: Ladene Artist , MD Age: 60 Referring MD:  Date of Birth: 1961-04-19 Gender: Female Account #: [id number] Procedure:                Colonoscopy Indications:              Screening for colorectal malignant neoplasm Medicines:                Monitored Anesthesia Care Procedure:                Pre-Anesthesia Assessment:                           - Prior to the procedure, a History and Physical                            was performed, and patient medications and                            allergies were reviewed. The patient's tolerance of                            previous anesthesia was also reviewed. The risks                            and benefits of the procedure and the sedation                            options and risks were discussed with the patient.                            All questions were answered, and informed consent                            was obtained. Prior Anticoagulants: The patient has                            taken no previous anticoagulant or antiplatelet                            agents. ASA Grade Assessment: II - A patient with                            mild systemic disease. After reviewing the risks                            and benefits, the patient was deemed in                            satisfactory condition to undergo the procedure.                           After obtaining informed consent, the colonoscope  was passed under direct vision. Throughout the                            procedure, the patient's blood pressure, pulse, and                            oxygen saturations were monitored continuously. The                            Olympus PCF-H190DL [id number]) Colonoscope was                            introduced through the anus and advanced to the the                            cecum,  identified by appendiceal orifice and                            ileocecal valve. The ileocecal valve, appendiceal                            orifice, and rectum were photographed. The quality                            of the bowel preparation was good. The colonoscopy                            was performed without difficulty. The patient                            tolerated the procedure well. Scope In: 10:07:47 AM Scope Out: 10:27:06 AM Scope Withdrawal Time: 0 hours 16 minutes 13 seconds  Total Procedure Duration: 0 hours 19 minutes 19 seconds  Findings:                 The perianal and digital rectal examinations were                            normal.                           A foreign body was found in the cecum. Removal of                            4-5 cm black linear plastic tube (? swizzel stick)                            was accomplished with a regular forceps.                           Multiple small-mouthed diverticula were found in                            the left colon. There was no evidence of  diverticular bleeding.                           The exam was otherwise without abnormality on                            direct and retroflexion views. Complications:            No immediate complications. Estimated blood loss:                            None. Estimated Blood Loss:     Estimated blood loss: none. Impression:               - Foreign body in the cecum. Removal was successful.                           - Mild diverticulosis in the left colon.                           - The examination was otherwise normal on direct                            and retroflexion views. Recommendation:           - Repeat colonoscopy in 10 years for screening                            purposes.                           - Patient has a contact number available for                            emergencies. The signs and symptoms of potential                             delayed complications were discussed with the                            patient. Return to normal activities tomorrow.                            Written discharge instructions were provided to the                            patient.                           - High fiber diet.                           - Continue present medications. Ladene Artist, MD 09/14/2021 10:40:50 AM This report has been signed electronically.

## 2021-09-14 NOTE — Progress Notes (Signed)
To PACU, VSS. Report to [email or web address] 

## 2021-11-16 ENCOUNTER — Ambulatory Visit: Payer: BC Managed Care – PPO | Admitting: Obstetrics and Gynecology

## 2021-12-11 DIAGNOSIS — L439 Lichen planus, unspecified: Secondary | ICD-10-CM | POA: Diagnosis not present

## 2021-12-11 DIAGNOSIS — D485 Neoplasm of uncertain behavior of skin: Secondary | ICD-10-CM | POA: Diagnosis not present

## 2022-01-11 NOTE — Progress Notes (Signed)
61 y.o. G19P0000 Married Caucasian female here for annual exam.   ? ?Denies vaginal bleeding.  ? ?Just had spider vein injections in her leg.  ? ?Wants labs.  ? ?Mother passed one month ago. ?Has good support for her loss and doing OK.  ? ?Retiring this year.  ? ?PCP:  None  ? ?Patient's last menstrual period was 10/12/2009 (approximate).     ?  ?    ?Sexually active: Yes.    ?The current method of family planning is post menopausal status.    ?Exercising: Yes.     Walking, running and weights ?Smoker:  no ? ?Health Maintenance: ?Pap:  08-25-18 Neg:Neg HR HPV, 07-18-16 Neg:Neg HR HPV, 06-03-13 Neg:Neg HR HPV ?History of abnormal Pap:  Yes,   2009 hx of ASCUS pap--no treatment. ?MMG:    Hx Br.Ca--Bil.Mastectomy ?Colonoscopy:  09-14-21 normal;10 years ?BMD:   n/a  Result  n/a ?TDaP:  07-31-17 ?Gardasil:   n/a ?HIV: 07-18-16 NR ?Hep C: 07-18-16 Neg ?Screening Labs:  today.  ?Shingrix:  completed.  ?Covid bilvant:  completed. ? ? reports that she has never smoked. She has never used smokeless tobacco. She reports current alcohol use of about 5.0 standard drinks per week. She reports that she does not use drugs. ? ?Past Medical History:  ?Diagnosis Date  ? 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 Breast cancer ([place]) 2008  ? bilateral mastectomy  ? Breast cancer ([place]) 02/2007  ? Right breast 2.8 cm + micromets in sentinel node  ? Breast fibroadenoma 2004  ? 5 cm Right breast  ? GERD (gastroesophageal reflux disease)   ? hiatal hernia  ? Post-operative nausea and vomiting   ? Scoliosis 2020  ? patient saw chiropractor  ? 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 on bx 2013, done for PMB (with three months with tamox)  ? ? ?Past Surgical History:  ?Procedure Laterality Date  ? ADENOIDECTOMY  1968  ? bilateral breast reconstruction  2008  ? bilateral mastectomy  04/2007  ? reconstruction, tamoxifen no SSRI while on it no rt or chemo. Started tamoxifen 7/08- quite June 2013  ? broken foot Right 02/2019  ? COLONOSCOPY    ? ORIF WRIST FRACTURE  Right 2019  ? ORIF WRIST FRACTURE Left 02/2019  ? ? ?Current Outpatient Medications  ?Medication Sig Dispense Refill  ? Ascorbic Acid (VITAMIN C) 1000 MG tablet Take 1,000 mg by mouth daily.    ? B Complex-Biotin-FA (B COMPLEX 100 TR PO) Take by mouth daily.    ? BIOTIN PO Take by mouth.    ? Calcium Carbonate-Vitamin D 600-400 MG-UNIT tablet Take 1 tablet by mouth daily.    ? Cholecalciferol 25 MCG (1000 UT) capsule Take 1,000 Units by mouth daily.    ? fish oil-omega-3 fatty acids 1000 MG capsule Take 2 g by mouth daily. Takes 1200 mg daily    ? Glucosamine-Chondroitin 1500-1200 MG/30ML LIQD Take 4 tablets by mouth daily.    ? ibuprofen (ADVIL) 400 MG tablet Take 400 mg by mouth every 6 (six) hours as needed.    ? Iodine, Kelp, (KELP PO) Take by mouth. Takes 250 mg daily    ? Multiple Vitamin (MULTIVITAMIN) tablet Take 1 tablet by mouth daily.    ? Zinc 50 MG CAPS Take by mouth.    ? ?No current facility-administered medications for this visit.  ? ? ?Family History  ?Adopted: Yes  ?Problem Relation Age of Onset  ? Broken bones Mother 13  ?  broken pelvis  ? Stroke Mother   ? Cancer Maternal Grandmother   ?     colon  ? Colon cancer Maternal Grandfather   ? Rectal cancer Neg Hx   ? Stomach cancer Neg Hx   ? ? ?Review of Systems  ?All other systems reviewed and are negative. ? ?Exam:   ?BP 118/74   Pulse 77   Ht 5' 3.5" (1.613 m)   Wt 131 lb (59.4 kg)   LMP 10/12/2009 (Approximate)   SpO2 97%   BMI 22.84 kg/m?     ?General appearance: alert, cooperative and appears stated age ?Head: normocephalic, without obvious abnormality, atraumatic ?Neck: no adenopathy, supple, symmetrical, trachea midline and thyroid normal to inspection and palpation ?Lungs: clear to auscultation bilaterally ?Breasts:  absent with bilateral reconstruction.  ?Heart: regular rate and rhythm ?Abdomen: soft, non-tender; no masses, no organomegaly ?Extremities: extremities normal, atraumatic, no cyanosis or edema ?Skin: skin color,  texture, turgor normal.  Areas of erythema of skin on her legs.  ?Lymph nodes: cervical, supraclavicular, and axillary nodes normal. ?Neurologic: grossly normal ? ?Pelvic: External genitalia:  no lesions ?             No abnormal inguinal nodes palpated. ?             Urethra:  normal appearing urethra with no masses, tenderness or lesions ?             Bartholins and Skenes: normal    ?             Vagina: normal appearing vagina with normal color and discharge, no lesions ?             Cervix: no lesions ?             Pap taken: no ?Bimanual Exam:  Uterus:  normal size, contour, position, consistency, mobility, non-tender ?             Adnexa: no mass, fullness, tenderness ?             Rectal exam: yes.  Confirms. ?             Anus:  normal sphincter tone, no lesions ? ?Chaperone was present for exam:  yes. ? ?Assessment:   ?Well woman visit with gynecologic exam. ?Routine labs.  ?Hx bilateral mastectomy for breast cancer.  BRCA neg.  ?Hx endometrial hyperplasia.  ?Hx fractures.  ?Remote hx ASCUS pap. ? ?Plan: ?Mammogram screening discussed. ?Self breast awareness reviewed. ?Pap and HR HPV as above. ?Guidelines for Calcium, Vitamin D, regular exercise program including cardiovascular and weight bearing exercise. ?Lipids, CMP, CBC, TSH.  ?Patient declines BMD. ?Follow up annually and prn.  ? ?After visit summary provided.  ? ? ? ?

## 2022-01-15 ENCOUNTER — Encounter: Payer: Self-pay | Admitting: Obstetrics and Gynecology

## 2022-01-15 ENCOUNTER — Ambulatory Visit (INDEPENDENT_AMBULATORY_CARE_PROVIDER_SITE_OTHER): Payer: BC Managed Care – PPO | Admitting: Obstetrics and Gynecology

## 2022-01-15 ENCOUNTER — Other Ambulatory Visit: Payer: Self-pay

## 2022-01-15 VITALS — BP 118/74 | HR 77 | Ht 63.5 in | Wt 131.0 lb

## 2022-01-15 DIAGNOSIS — Z0000 Encounter for general adult medical examination without abnormal findings: Secondary | ICD-10-CM

## 2022-01-15 DIAGNOSIS — Z01419 Encounter for gynecological examination (general) (routine) without abnormal findings: Secondary | ICD-10-CM

## 2022-01-15 NOTE — Patient Instructions (Signed)

## 2022-01-16 LAB — CBC
HCT: 39.5 % (ref 35.0–45.0)
Hemoglobin: 13.7 g/dL (ref 11.7–15.5)
MCH: 32.1 pg (ref 27.0–33.0)
MCHC: 34.7 g/dL (ref 32.0–36.0)
MCV: 92.5 fL (ref 80.0–100.0)
MPV: 9.9 fL (ref 7.5–12.5)
Platelets: 250 10*3/uL (ref 140–400)
RBC: 4.27 10*6/uL (ref 3.80–5.10)
RDW: 12.2 % (ref 11.0–15.0)
WBC: 4.4 10*3/uL (ref 3.8–10.8)

## 2022-01-16 LAB — COMPREHENSIVE METABOLIC PANEL
AG Ratio: 1.8 (calc) (ref 1.0–2.5)
ALT: 18 U/L (ref 6–29)
AST: 22 U/L (ref 10–35)
Albumin: 4.3 g/dL (ref 3.6–5.1)
Alkaline phosphatase (APISO): 67 U/L (ref 37–153)
BUN: 15 mg/dL (ref 7–25)
CO2: 27 mmol/L (ref 20–32)
Calcium: 9.8 mg/dL (ref 8.6–10.4)
Chloride: 100 mmol/L (ref 98–110)
Creat: 0.79 mg/dL (ref 0.50–1.05)
Globulin: 2.4 g/dL (calc) (ref 1.9–3.7)
Glucose, Bld: 97 mg/dL (ref 65–99)
Potassium: 4 mmol/L (ref 3.5–5.3)
Sodium: 136 mmol/L (ref 135–146)
Total Bilirubin: 0.4 mg/dL (ref 0.2–1.2)
Total Protein: 6.7 g/dL (ref 6.1–8.1)

## 2022-01-16 LAB — LIPID PANEL
Cholesterol: 233 mg/dL — ABNORMAL HIGH (ref <200)
HDL: 103 mg/dL (ref >=50)
LDL Cholesterol (Calc): 110 mg/dL (calc) — ABNORMAL HIGH
Non-HDL Cholesterol (Calc): 130 mg/dL (calc) — ABNORMAL HIGH (ref <130)
Total CHOL/HDL Ratio: 2.3 (calc) (ref <5.0)
Triglycerides: 102 mg/dL (ref <150)

## 2022-01-16 LAB — TSH: TSH: 2.43 mIU/L (ref 0.40–4.50)

## 2022-02-27 DIAGNOSIS — L538 Other specified erythematous conditions: Secondary | ICD-10-CM | POA: Diagnosis not present

## 2022-02-27 DIAGNOSIS — L814 Other melanin hyperpigmentation: Secondary | ICD-10-CM | POA: Diagnosis not present

## 2022-02-27 DIAGNOSIS — D225 Melanocytic nevi of trunk: Secondary | ICD-10-CM | POA: Diagnosis not present

## 2022-02-27 DIAGNOSIS — L298 Other pruritus: Secondary | ICD-10-CM | POA: Diagnosis not present

## 2022-02-27 DIAGNOSIS — L905 Scar conditions and fibrosis of skin: Secondary | ICD-10-CM | POA: Diagnosis not present

## 2022-02-27 DIAGNOSIS — L821 Other seborrheic keratosis: Secondary | ICD-10-CM | POA: Diagnosis not present

## 2022-02-27 DIAGNOSIS — L820 Inflamed seborrheic keratosis: Secondary | ICD-10-CM | POA: Diagnosis not present

## 2022-04-11 DIAGNOSIS — H6693 Otitis media, unspecified, bilateral: Secondary | ICD-10-CM | POA: Diagnosis not present

## 2022-04-20 DIAGNOSIS — H6501 Acute serous otitis media, right ear: Secondary | ICD-10-CM | POA: Diagnosis not present

## 2022-05-24 DIAGNOSIS — H6981 Other specified disorders of Eustachian tube, right ear: Secondary | ICD-10-CM | POA: Diagnosis not present

## 2023-01-17 ENCOUNTER — Ambulatory Visit: Payer: BC Managed Care – PPO | Admitting: Obstetrics and Gynecology

## 2023-01-23 NOTE — Progress Notes (Signed)
62 y.o. G0P0000 Married Caucasian female here for annual exam.    No concerns today.   Retired. Exercising more.   PCP:  None.   Patient's last menstrual period was 10/12/2009 (approximate).           Sexually active: Yes.    The current method of family planning is post menopausal status.    Exercising: Yes.     3-4x a week, jogging, elliptical,weight toning Smoker:  no  Health Maintenance: Pap:  08/25/18 neg: HR HPV neg History of abnormal Pap:  yes, 2009 hx of ASCUS pap--no treatment.  MMG:    Hx Br.Ca--Bil.Mastectomy  Colonoscopy:  09/14/21 BMD:   n/a  Result  n/a TDaP:  07/31/17 Gardasil:   no HIV: 07/18/16 NR Hep C: 07/18/16 neg Screening Labs:  PCP Shingrix:  completed.    reports that she has never smoked. She has never used smokeless tobacco. She reports current alcohol use of about 5.0 standard drinks of alcohol per week. She reports that she does not use drugs.  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 Started tamoxifen 7/08- quite June 2013   broken foot Right 02/2019   COLONOSCOPY     ORIF WRIST FRACTURE Right 2019   ORIF WRIST FRACTURE Left 02/2019    Current Outpatient Medications  Medication Sig Dispense Refill   Ascorbic Acid (VITAMIN C) 1000 MG tablet Take 1,000 mg by mouth daily.     B  Complex-Biotin-FA (B COMPLEX 100 TR PO) Take by mouth daily.     BIOTIN PO Take by mouth.     Calcium Carbonate-Vitamin D 600-400 MG-UNIT tablet Take 1 tablet by mouth daily.     Cholecalciferol 25 MCG (1000 UT) capsule Take 1,000 Units by mouth daily.     fish oil-omega-3 fatty acids 1000 MG capsule Take 2 g by mouth daily. Takes 1200 mg daily     Glucosamine-Chondroitin 1500-1200 MG/30ML LIQD Take 4 tablets by mouth daily.     ibuprofen (ADVIL) 400 MG tablet Take 400 mg by mouth every 6 (six) hours as needed.     Iodine, Kelp, (KELP PO) Take by mouth. Takes 250 mg daily     Multiple Vitamin (MULTIVITAMIN) tablet Take 1 tablet by mouth daily.     Zinc 50 MG CAPS Take by mouth.     No current facility-administered medications for this visit.    Family History  Adopted: Yes  Problem Relation Age of Onset   Broken bones Mother 42       broken pelvis   Stroke Mother    Cancer Maternal Grandmother        colon   Colon cancer Maternal Grandfather    Rectal cancer Neg Hx  Stomach cancer Neg Hx     Review of Systems  All other systems reviewed and are negative.   Exam:   BP 118/72 (BP Location: Left Arm, Patient Position: Sitting, Cuff Size: Normal)   Pulse 64   Ht 5\' 4"  (1.626 m)   Wt 132 lb (59.9 kg)   LMP 10/12/2009 (Approximate)   SpO2 98%   BMI 22.66 kg/m     General appearance: alert, cooperative and appears stated age Head: normocephalic, without obvious abnormality, atraumatic Neck: no adenopathy, supple, symmetrical, trachea midline and thyroid normal to inspection and palpation Lungs: clear to auscultation bilaterally Breasts: absent.  She has bilateral breast reconstruction.  No axillary adenopathy Heart: regular rate and rhythm Abdomen: soft, non-tender; no masses, no organomegaly Extremities: extremities normal, atraumatic, no cyanosis or edema Skin: skin color, texture, turgor normal. No rashes or lesions Lymph nodes: cervical, supraclavicular, and axillary  nodes normal. Neurologic: grossly normal  Pelvic: External genitalia:  no lesions              No abnormal inguinal nodes palpated.              Urethra:  normal appearing urethra with no masses, tenderness or lesions              Bartholins and Skenes: normal                 Vagina: normal appearing vagina with normal color and discharge, no lesions              Cervix: no lesions              Pap taken: yes Bimanual Exam:  Uterus:  normal size, contour, position, consistency, mobility, non-tender              Adnexa: no mass, fullness, tenderness              Rectal exam: yes.  Confirms.              Anus:  normal sphincter tone, no lesions  Chaperone was present for exam:  Raquel Sarna  Assessment:   Well woman visit with gynecologic exam. Hx bilateral mastectomy for breast cancer.  BRCA neg.  Hx endometrial hyperplasia.  Hx fractures.  Remote hx ASCUS pap.  Plan: Mammogram screening discussed. Self breast awareness reviewed. Pap and HR HPV collected. Guidelines for Calcium, Vitamin D, regular exercise program including cardiovascular and weight bearing exercise. Patient will establish care with PCP.   Follow up annually and prn.   After visit summary provided.

## 2023-02-06 ENCOUNTER — Encounter: Payer: Self-pay | Admitting: Obstetrics and Gynecology

## 2023-02-06 ENCOUNTER — Other Ambulatory Visit (HOSPITAL_COMMUNITY)
Admission: RE | Admit: 2023-02-06 | Discharge: 2023-02-06 | Disposition: A | Discharge status: Home / Self Care | Payer: BC Managed Care – PPO | Source: Ambulatory Visit | Attending: Obstetrics and Gynecology | Admitting: Obstetrics and Gynecology

## 2023-02-06 ENCOUNTER — Ambulatory Visit (INDEPENDENT_AMBULATORY_CARE_PROVIDER_SITE_OTHER): Payer: BC Managed Care – PPO | Admitting: Obstetrics and Gynecology

## 2023-02-06 VITALS — BP 118/72 | HR 64 | Ht 64.0 in | Wt 132.0 lb

## 2023-02-06 DIAGNOSIS — Z124 Encounter for screening for malignant neoplasm of cervix: Secondary | ICD-10-CM | POA: Diagnosis not present

## 2023-02-06 DIAGNOSIS — Z01419 Encounter for gynecological examination (general) (routine) without abnormal findings: Secondary | ICD-10-CM | POA: Diagnosis not present

## 2023-02-06 NOTE — Patient Instructions (Signed)

## 2023-02-08 LAB — CYTOLOGY - PAP
Comment: NEGATIVE
Diagnosis: NEGATIVE
High risk HPV: NEGATIVE

## 2023-04-11 DIAGNOSIS — L814 Other melanin hyperpigmentation: Secondary | ICD-10-CM | POA: Diagnosis not present

## 2023-04-11 DIAGNOSIS — Z872 Personal history of diseases of the skin and subcutaneous tissue: Secondary | ICD-10-CM | POA: Diagnosis not present

## 2023-04-11 DIAGNOSIS — L821 Other seborrheic keratosis: Secondary | ICD-10-CM | POA: Diagnosis not present

## 2023-04-11 DIAGNOSIS — D225 Melanocytic nevi of trunk: Secondary | ICD-10-CM | POA: Diagnosis not present

## 2023-07-17 DIAGNOSIS — U071 COVID-19: Secondary | ICD-10-CM | POA: Diagnosis not present

## 2023-07-24 ENCOUNTER — Ambulatory Visit (INDEPENDENT_AMBULATORY_CARE_PROVIDER_SITE_OTHER): Payer: BC Managed Care – PPO | Admitting: Internal Medicine

## 2023-07-24 ENCOUNTER — Encounter: Payer: Self-pay | Admitting: Internal Medicine

## 2023-07-24 VITALS — BP 126/84 | HR 71 | Temp 98.5°F | Ht 64.0 in | Wt 130.0 lb

## 2023-07-24 DIAGNOSIS — Z853 Personal history of malignant neoplasm of breast: Secondary | ICD-10-CM

## 2023-07-24 DIAGNOSIS — Z23 Encounter for immunization: Secondary | ICD-10-CM | POA: Diagnosis not present

## 2023-07-24 DIAGNOSIS — Z0000 Encounter for general adult medical examination without abnormal findings: Secondary | ICD-10-CM | POA: Diagnosis not present

## 2023-07-24 LAB — LIPID PANEL
Cholesterol: 218 mg/dL — ABNORMAL HIGH (ref 0–200)
HDL: 96.7 mg/dL (ref >39.00)
LDL Cholesterol: 108 mg/dL — ABNORMAL HIGH (ref 0–99)
NonHDL: 121.2
Total CHOL/HDL Ratio: 2
Triglycerides: 64 mg/dL (ref 0.0–149.0)
VLDL: 12.8 mg/dL (ref 0.0–40.0)

## 2023-07-24 LAB — CBC
HCT: 39.6 % (ref 36.0–46.0)
Hemoglobin: 13.7 g/dL (ref 12.0–15.0)
MCHC: 34.5 g/dL (ref 30.0–36.0)
MCV: 93 fl (ref 78.0–100.0)
Platelets: 257 10*3/uL (ref 150.0–400.0)
RBC: 4.26 Mil/uL (ref 3.87–5.11)
RDW: 12.1 % (ref 11.5–15.5)
WBC: 5.1 10*3/uL (ref 4.0–10.5)

## 2023-07-24 LAB — COMPREHENSIVE METABOLIC PANEL
ALT: 18 U/L (ref 0–35)
AST: 28 U/L (ref 0–37)
Albumin: 4.5 g/dL (ref 3.5–5.2)
Alkaline Phosphatase: 51 U/L (ref 39–117)
BUN: 11 mg/dL (ref 6–23)
CO2: 31 meq/L (ref 19–32)
Calcium: 10.2 mg/dL (ref 8.4–10.5)
Chloride: 94 meq/L — ABNORMAL LOW (ref 96–112)
Creatinine, Ser: 0.81 mg/dL (ref 0.40–1.20)
GFR: 77.23 mL/min (ref >60.00)
Glucose, Bld: 82 mg/dL (ref 70–99)
Potassium: 4 meq/L (ref 3.5–5.1)
Sodium: 131 meq/L — ABNORMAL LOW (ref 135–145)
Total Bilirubin: 0.4 mg/dL (ref 0.2–1.2)
Total Protein: 7.1 g/dL (ref 6.0–8.3)

## 2023-07-24 LAB — VITAMIN D 25 HYDROXY (VIT D DEFICIENCY, FRACTURES): VITD: 68.67 ng/mL (ref 30.00–100.00)

## 2023-07-24 NOTE — Assessment & Plan Note (Signed)
Bilateral mastectomy 2008 and modifier changed to exclude from future mammograms. No changes clinically.

## 2023-07-24 NOTE — Assessment & Plan Note (Signed)
Flu shot given. Shingrix complete getting records. Tetanus up to date. Colonoscopy up to date. Mammogram up to date, pap smear up to date. Counseled about sun safety and mole surveillance. Counseled about the dangers of distracted driving. Given 10 year screening recommendations.

## 2023-07-24 NOTE — Progress Notes (Signed)
   Subjective:   Patient ID: Erika Parsons, female    DOB: 22-Feb-1961, 62 y.o.   MRN: [id number]  HPI The patient is a new 62 YO female coming in for physical  PMH, [facility], social history reviewed and updated  Review of Systems  Constitutional: Negative.   HENT: Negative.    Eyes: Negative.   Respiratory:  Negative for cough, chest tightness and shortness of breath.   Cardiovascular:  Negative for chest pain, palpitations and leg swelling.  Gastrointestinal:  Negative for abdominal distention, abdominal pain, constipation, diarrhea, nausea and vomiting.  Musculoskeletal: Negative.   Skin: Negative.   Neurological: Negative.   Psychiatric/Behavioral: Negative.      Objective:  Physical Exam Constitutional:      Appearance: She is well-developed.  HENT:     Head: Normocephalic and atraumatic.  Cardiovascular:     Rate and Rhythm: Normal rate and regular rhythm.  Pulmonary:     Effort: Pulmonary effort is normal. No respiratory distress.     Breath sounds: Normal breath sounds. No wheezing or rales.  Abdominal:     General: Bowel sounds are normal. There is no distension.     Palpations: Abdomen is soft.     Tenderness: There is no abdominal tenderness. There is no rebound.  Musculoskeletal:     Cervical back: Normal range of motion.  Skin:    General: Skin is warm and dry.  Neurological:     Mental Status: She is alert and oriented to person, place, and time.     Coordination: Coordination normal.     Vitals:   07/24/23 0925  BP: 126/84  Pulse: 71  Temp: 98.5 F (36.9 C)  TempSrc: Oral  SpO2: 97%  Weight: 130 lb (59 kg)  Height: 5\' 4"  (1.626 m)    Assessment & Plan:  Flu shot given at visit

## 2023-10-01 ENCOUNTER — Encounter: Payer: Self-pay | Admitting: Internal Medicine

## 2023-10-01 ENCOUNTER — Ambulatory Visit: Payer: BC Managed Care – PPO | Admitting: Internal Medicine

## 2023-10-01 VITALS — BP 122/100 | HR 66 | Temp 98.4°F | Ht 64.0 in | Wt 134.0 lb

## 2023-10-01 DIAGNOSIS — R002 Palpitations: Secondary | ICD-10-CM | POA: Insufficient documentation

## 2023-10-01 LAB — TSH: TSH: 3.64 u[IU]/mL (ref 0.35–5.50)

## 2023-10-01 NOTE — Patient Instructions (Signed)
Let us know if the heart palpitations come back and we can order the monitor.

## 2023-10-01 NOTE — Progress Notes (Signed)
   Subjective:   Patient ID: AMBERA CONK, female    DOB: 1961-02-15, 62 y.o.   MRN: [id number]  HPI The patient is a 62 YO female coming in 1 month of heart palpitations. Normally at night time but sometimes during the day. Not as much in the last week. No change to diet, exercise, medications.   Review of Systems  Constitutional: Negative.   HENT: Negative.    Eyes: Negative.   Respiratory:  Negative for cough, chest tightness and shortness of breath.   Cardiovascular:  Positive for palpitations. Negative for chest pain and leg swelling.  Gastrointestinal:  Negative for abdominal distention, abdominal pain, constipation, diarrhea, nausea and vomiting.  Musculoskeletal: Negative.   Skin: Negative.   Neurological: Negative.   Psychiatric/Behavioral: Negative.      Objective:  Physical Exam Constitutional:      Appearance: She is well-developed.  HENT:     Head: Normocephalic and atraumatic.  Cardiovascular:     Rate and Rhythm: Normal rate and regular rhythm.  Pulmonary:     Effort: Pulmonary effort is normal. No respiratory distress.     Breath sounds: Normal breath sounds. No wheezing or rales.  Abdominal:     General: Bowel sounds are normal. There is no distension.     Palpations: Abdomen is soft.     Tenderness: There is no abdominal tenderness. There is no rebound.  Musculoskeletal:     Cervical back: Normal range of motion.  Skin:    General: Skin is warm and dry.  Neurological:     Mental Status: She is alert and oriented to person, place, and time.     Coordination: Coordination normal.     Vitals:   10/01/23 0837 10/01/23 0843  BP: (!) 122/100 (!) 122/100  Pulse: 66   Temp: 98.4 F (36.9 C)   TempSrc: Oral   SpO2: 98%   Weight: 134 lb (60.8 kg)   Height: 5\' 4"  (1.626 m)    EKG: Rate 69, axis normal, interval normal, possible LAE, no st or t wave changes, no prior to compare  Assessment & Plan:

## 2023-10-01 NOTE — Assessment & Plan Note (Signed)
EKG done which is normal. Symptoms have resolved some. Checking TSH. If symptoms return she will let us know and we will order holter monitoring.

## 2023-10-04 ENCOUNTER — Ambulatory Visit: Payer: BC Managed Care – PPO | Admitting: Internal Medicine

## 2024-02-11 DIAGNOSIS — H04123 Dry eye syndrome of bilateral lacrimal glands: Secondary | ICD-10-CM | POA: Diagnosis not present

## 2024-02-11 DIAGNOSIS — H5213 Myopia, bilateral: Secondary | ICD-10-CM | POA: Diagnosis not present

## 2024-02-11 DIAGNOSIS — H1045 Other chronic allergic conjunctivitis: Secondary | ICD-10-CM | POA: Diagnosis not present

## 2024-02-11 DIAGNOSIS — H2513 Age-related nuclear cataract, bilateral: Secondary | ICD-10-CM | POA: Diagnosis not present

## 2024-02-11 DIAGNOSIS — H524 Presbyopia: Secondary | ICD-10-CM | POA: Diagnosis not present

## 2024-02-11 DIAGNOSIS — H52222 Regular astigmatism, left eye: Secondary | ICD-10-CM | POA: Diagnosis not present

## 2024-02-19 NOTE — Progress Notes (Signed)
 63 y.o. G77P0000 Married Caucasian female here for annual exam.    Denies vaginal bleeding, discharge, bladder or bowel concerns, or breast health problems.  Saw PCP in November 2024 for palpitations.   Normal evaluation per patient.    PCP: Adelia Homestead, MD   Patient's last menstrual period was 10/12/2009 (approximate).           Sexually active: Yes.    The current method of family planning is post menopausal status.    Menopausal hormone therapy:  No Exercising: Yes.     Walking and strength training  Smoker:  no  OB History  Gravida Para Term Preterm AB Living  0 0 0 0 0 0  SAB IAB Ectopic Multiple Live Births  0 0 0 0      HEALTH MAINTENANCE: Last 2 paps:  02/06/23 neg, neg HR HPV, 08/25/18 neg, neg HR HPV  History of abnormal Pap or positive HPV:  no Mammogram:   Hx of breast cancer Bilateral Mastectomy  Colonoscopy: 09/14/21 - due in 2032.  Bone Density:  n/a  Result  n/a   Immunization History  Administered Date(s) Administered   Influenza Inj Mdck Quad Pf 08/16/2019   Influenza, Seasonal, Injecte, Preservative Fre 07/24/2023   Influenza,inj,Quad PF,6+ Mos 08/01/2018   Influenza,inj,quad, With Preservative 08/27/2017   Moderna Sars-Covid-2 Vaccination 01/23/2020, 02/23/2020, 09/22/2020   Tdap 02/05/2007, 07/31/2017   Took Shingrix vaccine.     reports that she has never smoked. She has never used smokeless tobacco. She reports current alcohol use of about 5.0 standard drinks of alcohol per week. She reports that she does not use drugs.  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   Breast cancer [facility]) 2008   bilateral mastectomy   Breast cancer (HCC) 02/2007   Right breast 2.8 cm + micromets in sentinel node   Breast fibroadenoma 2004   5 cm Right breast   GERD (gastroesophageal reflux disease)    hiatal hernia   Post-operative nausea and vomiting    Scoliosis 2020   patient saw chiropractor   Simple endometrial  hyperplasia 07/15/2012   on bx 2013, done for PMB (with three months with tamox)    Past Surgical History:  Procedure Laterality Date   ADENOIDECTOMY  1968   bilateral breast reconstruction  2008   bilateral mastectomy  04/2007   reconstruction, tamoxifen  no SSRI while on it no rt or chemo. Started tamoxifen  7/08- quite June 2013   broken foot Right 02/2019   COLONOSCOPY     ORIF WRIST FRACTURE Right 2019   ORIF WRIST FRACTURE Left 02/2019    Current Outpatient Medications  Medication Sig Dispense Refill   Ascorbic Acid (VITAMIN C) 1000 MG tablet Take 1,000 mg by mouth daily.     B Complex-Biotin-FA (B COMPLEX 100 TR PO) Take by mouth daily.     BIOTIN PO Take by mouth.     Calcium Carbonate-Vitamin D  600-400 MG-UNIT tablet Take 1 tablet by mouth daily.     Cholecalciferol 25 MCG (1000 UT) capsule Take 1,000 Units by mouth daily.     Coenzyme Q10 (COQ-10) 100 MG CAPS      fish oil-omega-3 fatty acids 1000 MG capsule Take 2 g by mouth daily. Takes 1200 mg daily     Glucosamine-Chondroitin 1500-1200 MG/30ML LIQD Take 4 tablets by mouth daily.     Iodine, Kelp, (KELP PO) Take by mouth. Takes 250 mg daily  Multiple Vitamin (MULTIVITAMIN) tablet Take 1 tablet by mouth daily.     Zinc 50 MG CAPS Take by mouth.     No current facility-administered medications for this visit.    ALLERGIES: Patient has no known allergies.  Family History  Adopted: Yes  Problem Relation Age of Onset   Broken bones Mother 11       broken pelvis   Stroke Mother    Cancer Maternal Grandmother        colon   Colon cancer Maternal Grandfather    Rectal cancer Neg Hx    Stomach cancer Neg Hx     Review of Systems  All other systems reviewed and are negative.   PHYSICAL EXAM:  BP 118/82 (BP Location: Left Arm, Patient Position: Sitting)   Pulse 67   Ht 5' 4.5" (1.638 m)   Wt 132 lb (59.9 kg)   LMP 10/12/2009 (Approximate)   SpO2 99%   BMI 22.31 kg/m     General appearance: alert,  cooperative and appears stated age Head: normocephalic, without obvious abnormality, atraumatic Neck: no adenopathy, supple, symmetrical, trachea midline and thyroid  normal to inspection and palpation Lungs: clear to auscultation bilaterally Breasts: absent with reconstruction bilaterally, no masses, No axillary adenopathy Heart: regular rate and rhythm Abdomen: soft, non-tender; no masses, no organomegaly Extremities: extremities normal, atraumatic, no cyanosis or edema Skin: skin color, texture, turgor normal. No rashes or lesions Lymph nodes: cervical, supraclavicular, and axillary nodes normal. Neurologic: grossly normal  Pelvic: External genitalia:  no lesions              No abnormal inguinal nodes palpated.              Urethra:  normal appearing urethra with no masses, tenderness or lesions              Bartholins and Skenes: normal                 Vagina: normal appearing vagina with normal color and discharge, no lesions              Cervix: no lesions              Pap taken: no Bimanual Exam:  Uterus:  normal size, contour, position, consistency, mobility, non-tender              Adnexa: no mass, fullness, tenderness              Rectal exam: yes.  Confirms.              Anus:  normal sphincter tone, no lesions  Chaperone was present for exam:   Cottie Diss, CMA  ASSESSMENT: Well woman visit with gynecologic exam. Hx bilateral mastectomy for breast cancer.  BRCA neg.  Hx endometrial hyperplasia.  Hx fractures.  Remote hx ASCUS pap. PHQ-9: 0  PLAN: Mammogram screening discussed. Self breast awareness reviewed. Pap and HRV collected:  no.  Due in 2029.   Guidelines for Calcium, Vitamin D , regular exercise program including cardiovascular and weight bearing exercise. Medication refills:  NA BMD next year.  Labs with PCP.  Follow up:  yearly and prn.

## 2024-03-04 ENCOUNTER — Encounter: Payer: Self-pay | Admitting: Obstetrics and Gynecology

## 2024-03-04 ENCOUNTER — Ambulatory Visit (INDEPENDENT_AMBULATORY_CARE_PROVIDER_SITE_OTHER): Payer: BC Managed Care – PPO | Admitting: Obstetrics and Gynecology

## 2024-03-04 VITALS — BP 118/82 | HR 67 | Ht 64.5 in | Wt 132.0 lb

## 2024-03-04 DIAGNOSIS — Z01419 Encounter for gynecological examination (general) (routine) without abnormal findings: Secondary | ICD-10-CM

## 2024-03-04 DIAGNOSIS — Z1331 Encounter for screening for depression: Secondary | ICD-10-CM | POA: Diagnosis not present

## 2024-03-04 NOTE — Patient Instructions (Signed)

## 2024-04-02 DIAGNOSIS — Z853 Personal history of malignant neoplasm of breast: Secondary | ICD-10-CM | POA: Diagnosis not present

## 2024-06-02 DIAGNOSIS — L538 Other specified erythematous conditions: Secondary | ICD-10-CM | POA: Diagnosis not present

## 2024-06-02 DIAGNOSIS — D225 Melanocytic nevi of trunk: Secondary | ICD-10-CM | POA: Diagnosis not present

## 2024-06-02 DIAGNOSIS — L821 Other seborrheic keratosis: Secondary | ICD-10-CM | POA: Diagnosis not present

## 2024-06-02 DIAGNOSIS — L814 Other melanin hyperpigmentation: Secondary | ICD-10-CM | POA: Diagnosis not present

## 2024-06-02 DIAGNOSIS — Z872 Personal history of diseases of the skin and subcutaneous tissue: Secondary | ICD-10-CM | POA: Diagnosis not present

## 2024-06-02 DIAGNOSIS — D485 Neoplasm of uncertain behavior of skin: Secondary | ICD-10-CM | POA: Diagnosis not present

## 2024-06-02 DIAGNOSIS — L820 Inflamed seborrheic keratosis: Secondary | ICD-10-CM | POA: Diagnosis not present

## 2024-07-02 ENCOUNTER — Ambulatory Visit: Admission: EM | Admit: 2024-07-02 | Discharge: 2024-07-02 | Disposition: A | Discharge status: Home / Self Care

## 2024-07-02 ENCOUNTER — Ambulatory Visit (INDEPENDENT_AMBULATORY_CARE_PROVIDER_SITE_OTHER)

## 2024-07-02 ENCOUNTER — Ambulatory Visit
Admission: RE | Admit: 2024-07-02 | Discharge: 2024-07-02 | Disposition: A | Discharge status: Home / Self Care | Source: Ambulatory Visit

## 2024-07-02 VITALS — BP 134/90 | HR 87 | Temp 98.3°F | Resp 17

## 2024-07-02 DIAGNOSIS — M25572 Pain in left ankle and joints of left foot: Secondary | ICD-10-CM

## 2024-07-02 DIAGNOSIS — S82832A Other fracture of upper and lower end of left fibula, initial encounter for closed fracture: Secondary | ICD-10-CM

## 2024-07-02 DIAGNOSIS — S93402A Sprain of unspecified ligament of left ankle, initial encounter: Secondary | ICD-10-CM | POA: Diagnosis not present

## 2024-07-02 DIAGNOSIS — M7989 Other specified soft tissue disorders: Secondary | ICD-10-CM | POA: Diagnosis not present

## 2024-07-02 NOTE — Discharge Instructions (Addendum)
  1. Acute left ankle pain (Primary) - Apply ace wrap for compression and protection - Apply CAM boot for protection and immobilization - DG Ankle Complete Left; Future will be completed at [facility].  After x-ray you may go home and results will be called to you. - If fracture is noted by radiologist, you will be provided with referral for follow-up with orthopedics.

## 2024-07-02 NOTE — ED Triage Notes (Addendum)
 Pt c/o left ankle pain after she fell yesterday while outside feeding birds. Pt denies hitting head.She is able to put some weight on foot   She is okay with going to [facility] for x-ray.

## 2024-07-02 NOTE — ED Provider Notes (Signed)
 [facility]  Note:  This document was prepared using Dragon voice recognition software and may include unintentional dictation errors.  MRN: [id number] DOB: [birth year]  Subjective:   Erika Parsons is a 63 y.o. female presenting for left ankle pain and swelling since yesterday.  Patient reports she was out in the yard when she twisted her ankle causing her to fall.  Patient reports pain and inflammation and increased pain with walking.  Patient has taken ibuprofen and applied ice with minimal improvement.  Patient was concern for possible fracture versus ankle sprain.  No current facility-administered medications for this encounter.  Current Outpatient Medications:    Ascorbic Acid (VITAMIN C) 1000 MG tablet, Take 1,000 mg by mouth daily., Disp: , Rfl:    B Complex-Biotin-FA (B COMPLEX 100 TR PO), Take by mouth daily., Disp: , Rfl:    BIOTIN PO, Take by mouth., Disp: , Rfl:    Calcium Carbonate-Vitamin D  600-400 MG-UNIT tablet, Take 1 tablet by mouth daily., Disp: , Rfl:    Cholecalciferol 25 MCG (1000 UT) capsule, Take 1,000 Units by mouth daily., Disp: , Rfl:    Coenzyme Q10 (COQ-10) 100 MG CAPS, , Disp: , Rfl:    fish oil-omega-3 fatty acids 1000 MG capsule, Take 2 g by mouth daily. Takes 1200 mg daily, Disp: , Rfl:    Glucosamine-Chondroitin 1500-1200 MG/30ML LIQD, Take 4 tablets by mouth daily., Disp: , Rfl:    Iodine, Kelp, (KELP PO), Take by mouth. 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   Breast cancer (HCC) 2008   bilateral mastectomy   Breast cancer (HCC) 02/2007   Right breast 2.8 cm + micromets in sentinel node   Breast fibroadenoma 2004   5 cm Right breast   GERD (gastroesophageal reflux disease)    hiatal hernia    Post-operative nausea and vomiting    Scoliosis 2020   patient saw chiropractor   Simple endometrial hyperplasia 07/15/2012   on bx 2013, done for PMB (with three months with tamox)     Past Surgical History:  Procedure Laterality Date   ADENOIDECTOMY  1968   bilateral breast reconstruction  2008   bilateral mastectomy  04/2007   reconstruction, tamoxifen  no SSRI while on it no rt or chemo. Started tamoxifen  7/08- quite June 2013   broken foot Right 02/2019   COLONOSCOPY     ORIF WRIST FRACTURE Right 2019   ORIF WRIST FRACTURE Left 02/2019    Family History  Adopted: Yes  Problem Relation Age of Onset   Broken bones Mother 49       broken pelvis   Stroke Mother    Cancer Maternal Grandmother        colon   Colon cancer Maternal Grandfather    Rectal cancer Neg Hx    Stomach cancer Neg Hx     Social History   Tobacco Use   Smoking status: Never   Smokeless tobacco: Never  Vaping Use   Vaping status: Never Used  Substance Use Topics   Alcohol use: Yes    Alcohol/week: 5.0 standard drinks of alcohol    Types: 5 Glasses of wine per week    Comment: 4-5 glasses of  wine a week or a bloody mary   Drug use: No    ROS Refer to HPI for ROS details.  Objective:    Vitals: BP (!) 134/90 (BP Location: Right Arm)   Pulse 87   Temp 98.3 F (36.8 C)   Resp 17   LMP 10/12/2009 (Approximate)   SpO2 97%   Physical Exam Vitals and nursing note reviewed.  Constitutional:      General: She is not in acute distress.    Appearance: Normal appearance. She is well-developed. She is not ill-appearing or toxic-appearing.  HENT:     Head: Normocephalic and atraumatic.  Cardiovascular:     Rate and Rhythm: Normal rate.  Pulmonary:     Effort: Pulmonary effort is normal. No respiratory distress.  Musculoskeletal:     Left ankle: Swelling and ecchymosis present. No deformity. Tenderness present over the lateral malleolus. Decreased range of motion. Normal pulse.     Left  Achilles Tendon: No tenderness or defects.  Skin:    General: Skin is warm and dry.  Neurological:     General: No focal deficit present.     Mental Status: She is alert and oriented to person, place, and time.  Psychiatric:        Mood and Affect: Mood normal.        Behavior: Behavior normal.     Procedures  No results found for this or any previous visit (from the past 24 hours).  Assessment and Plan :     Discharge Instructions       1. Acute left ankle pain (Primary) - Apply ace wrap for compression and protection - Apply CAM boot for protection and immobilization - DG Ankle Complete Left; Future will be completed at [facility].  After x-ray you may go home and results will be called to you. - If fracture is noted by radiologist, you will be provided with referral for follow-up with orthopedics.   Addendum: X-ray reveals suspicious distal fibular fracture, nondisplaced.  Patient contacted and advised to follow-up with orthopedics for further evaluation and treatment.   Lanorris Kalisz B Jaquaveon Bilal   Aletta Edmunds, [place], [place] 07/02/24 1137

## 2024-07-03 DIAGNOSIS — M25572 Pain in left ankle and joints of left foot: Secondary | ICD-10-CM | POA: Diagnosis not present

## 2024-07-09 ENCOUNTER — Ambulatory Visit (HOSPITAL_COMMUNITY): Payer: Self-pay

## 2024-07-17 DIAGNOSIS — M25572 Pain in left ankle and joints of left foot: Secondary | ICD-10-CM | POA: Diagnosis not present

## 2024-07-30 ENCOUNTER — Ambulatory Visit: Payer: BC Managed Care – PPO | Admitting: Internal Medicine

## 2024-07-31 DIAGNOSIS — M25572 Pain in left ankle and joints of left foot: Secondary | ICD-10-CM | POA: Diagnosis not present

## 2024-08-03 ENCOUNTER — Telehealth: Payer: Self-pay | Admitting: Obstetrics and Gynecology

## 2024-08-03 ENCOUNTER — Ambulatory Visit: Payer: BC Managed Care – PPO | Admitting: Internal Medicine

## 2024-08-03 DIAGNOSIS — T07XXXA Unspecified multiple injuries, initial encounter: Secondary | ICD-10-CM

## 2024-08-03 DIAGNOSIS — Z780 Asymptomatic menopausal state: Secondary | ICD-10-CM

## 2024-08-03 NOTE — Telephone Encounter (Signed)
 Please contact patient in follow up to records I am receiving from [facility] about patient's fibula fracture.   I recommend we get a bone density scheduled for her.  [facility] locations can do this or the location of her choice.   She has hx of prior wrist fractures as well.

## 2024-08-04 NOTE — Telephone Encounter (Signed)
 Patient states she has a vacation coming up soon & will need to do the bone density after that. Patient is aware that is fine. She wants to have it done at [facility]. I told patient we would place an order for her to have it done & then I would send a message to her in [place] with the phone number for her to call them to schedule it.  Please place diagnosis, reason for exam & sign the order.

## 2024-08-04 NOTE — Telephone Encounter (Signed)
 I signed the order for the bone density at [facility].

## 2024-08-04 NOTE — Telephone Encounter (Signed)
 Left message to call back.

## 2024-08-10 DIAGNOSIS — M25572 Pain in left ankle and joints of left foot: Secondary | ICD-10-CM | POA: Diagnosis not present

## 2024-08-31 DIAGNOSIS — S82832D Other fracture of upper and lower end of left fibula, subsequent encounter for closed fracture with routine healing: Secondary | ICD-10-CM | POA: Diagnosis not present

## 2024-08-31 DIAGNOSIS — M25572 Pain in left ankle and joints of left foot: Secondary | ICD-10-CM | POA: Diagnosis not present

## 2024-09-14 ENCOUNTER — Ambulatory Visit: Payer: Self-pay | Admitting: Internal Medicine

## 2024-09-14 ENCOUNTER — Ambulatory Visit: Admitting: Internal Medicine

## 2024-09-14 ENCOUNTER — Encounter: Payer: Self-pay | Admitting: Internal Medicine

## 2024-09-14 VITALS — BP 126/80 | HR 68 | Temp 98.4°F | Ht 64.5 in | Wt 132.8 lb

## 2024-09-14 DIAGNOSIS — Z0000 Encounter for general adult medical examination without abnormal findings: Secondary | ICD-10-CM

## 2024-09-14 DIAGNOSIS — Z853 Personal history of malignant neoplasm of breast: Secondary | ICD-10-CM

## 2024-09-14 DIAGNOSIS — R002 Palpitations: Secondary | ICD-10-CM

## 2024-09-14 LAB — VITAMIN D 25 HYDROXY (VIT D DEFICIENCY, FRACTURES): VITD: 69.2 ng/mL (ref 30.00–100.00)

## 2024-09-14 LAB — CBC
HCT: 39.1 % (ref 36.0–46.0)
Hemoglobin: 13.4 g/dL (ref 12.0–15.0)
MCHC: 34.2 g/dL (ref 30.0–36.0)
MCV: 92.9 fl (ref 78.0–100.0)
Platelets: 214 K/uL (ref 150.0–400.0)
RBC: 4.21 Mil/uL (ref 3.87–5.11)
RDW: 12.9 % (ref 11.5–15.5)
WBC: 4 K/uL (ref 4.0–10.5)

## 2024-09-14 LAB — LIPID PANEL
Cholesterol: 239 mg/dL — ABNORMAL HIGH (ref 0–200)
HDL: 90.8 mg/dL (ref >39.00)
LDL Cholesterol: 131 mg/dL — ABNORMAL HIGH (ref 0–99)
NonHDL: 147.84
Total CHOL/HDL Ratio: 3
Triglycerides: 84 mg/dL (ref 0.0–149.0)
VLDL: 16.8 mg/dL (ref 0.0–40.0)

## 2024-09-14 LAB — COMPREHENSIVE METABOLIC PANEL WITH GFR
ALT: 18 U/L (ref 0–35)
AST: 20 U/L (ref 0–37)
Albumin: 4.4 g/dL (ref 3.5–5.2)
Alkaline Phosphatase: 54 U/L (ref 39–117)
BUN: 12 mg/dL (ref 6–23)
CO2: 28 meq/L (ref 19–32)
Calcium: 9.8 mg/dL (ref 8.4–10.5)
Chloride: 100 meq/L (ref 96–112)
Creatinine, Ser: 0.74 mg/dL (ref 0.40–1.20)
GFR: 85.38 mL/min (ref >60.00)
Glucose, Bld: 90 mg/dL (ref 70–99)
Potassium: 3.8 meq/L (ref 3.5–5.1)
Sodium: 136 meq/L (ref 135–145)
Total Bilirubin: 0.5 mg/dL (ref 0.2–1.2)
Total Protein: 6.9 g/dL (ref 6.0–8.3)

## 2024-09-14 NOTE — Progress Notes (Signed)
" ° °  Subjective:   Patient ID: Erika Parsons, female    DOB: January 19, 1961, 63 y.o.   MRN: [id number]  The patient is here for physical. Pertinent topics discussed: Discussed the use of AI scribe software for clinical note transcription with the patient, who gave verbal consent to proceed.  History of Present Illness Erika Parsons is a 63 year old female who presents for follow-up after an ankle fracture.  In August, she experienced a fall resulting in a broken ankle. She initially wore a boot for a little over a month and then transitioned to an ankle brace while in [place] . Currently, she is not using a brace, but the ankle remains slightly swollen, particularly after increased activity. She experiences minimal pain, mostly occurring at night. She is actively exercising to improve her condition and notes improvement in her symptoms.  She recently had a dermatology appointment where a precancerous lesion was removed from her cheek. She is now more vigilant about sun protection.  PMH, [facility], social history reviewed and updated  Review of Systems  Constitutional: Negative.   HENT: Negative.    Eyes: Negative.   Respiratory:  Negative for cough, chest tightness and shortness of breath.   Cardiovascular:  Negative for chest pain, palpitations and leg swelling.  Gastrointestinal:  Negative for abdominal distention, abdominal pain, constipation, diarrhea, nausea and vomiting.  Musculoskeletal: Negative.   Skin: Negative.   Neurological: Negative.   Psychiatric/Behavioral: Negative.      Objective:  Physical Exam Constitutional:      Appearance: She is well-developed.  HENT:     Head: Normocephalic and atraumatic.  Cardiovascular:     Rate and Rhythm: Normal rate and regular rhythm.  Pulmonary:     Effort: Pulmonary effort is normal. No respiratory distress.     Breath sounds: Normal breath sounds. No wheezing or rales.  Abdominal:     General: Bowel sounds are normal. There is no  distension.     Palpations: Abdomen is soft.     Tenderness: There is no abdominal tenderness.  Musculoskeletal:     Cervical back: Normal range of motion.  Skin:    General: Skin is warm and dry.  Neurological:     Mental Status: She is alert and oriented to person, place, and time.     Coordination: Coordination normal.     Vitals:   09/14/24 0951  BP: 126/80  Pulse: 68  Temp: 98.4 F (36.9 C)  TempSrc: Temporal  SpO2: 98%  Weight: 132 lb 12.8 oz (60.2 kg)  Height: 5' 4.5 (1.638 m)    Assessment & Plan:   "

## 2024-09-17 DIAGNOSIS — Z872 Personal history of diseases of the skin and subcutaneous tissue: Secondary | ICD-10-CM | POA: Diagnosis not present

## 2024-09-17 DIAGNOSIS — Z09 Encounter for follow-up examination after completed treatment for conditions other than malignant neoplasm: Secondary | ICD-10-CM | POA: Diagnosis not present

## 2024-09-18 NOTE — Assessment & Plan Note (Signed)
 Flu shot up to date. Pneumonia complete. Shingrix complete. Tetanus up to date. Colonoscopy up to date. Mammogram up to date, pap smear up to date. Counseled about sun safety and mole surveillance. Counseled about the dangers of distracted driving. Given 10 year screening recommendations.

## 2024-09-18 NOTE — Assessment & Plan Note (Signed)
Continue yearly mammogram.

## 2024-09-18 NOTE — Assessment & Plan Note (Signed)
 Rare and triggered by caffeine. Monitor.

## 2024-10-06 ENCOUNTER — Ambulatory Visit: Admitting: Internal Medicine

## 2024-10-06 ENCOUNTER — Encounter: Payer: Self-pay | Admitting: Internal Medicine

## 2024-10-06 VITALS — BP 130/80 | HR 74 | Temp 98.4°F | Ht 64.5 in | Wt 134.0 lb

## 2024-10-06 DIAGNOSIS — L989 Disorder of the skin and subcutaneous tissue, unspecified: Secondary | ICD-10-CM | POA: Insufficient documentation

## 2024-10-06 DIAGNOSIS — D485 Neoplasm of uncertain behavior of skin: Secondary | ICD-10-CM | POA: Diagnosis not present

## 2024-10-06 NOTE — Assessment & Plan Note (Signed)
 Lesion with rolled border and central scab, suspicious for skin cancer. She is seeing dermatologist later today for evaluation and likely excision.

## 2024-10-06 NOTE — Progress Notes (Signed)
   Subjective:   Patient ID: Erika Parsons, female    DOB: October 21, 1961, 63 y.o.   MRN: [id number]  Discussed the use of AI scribe software for clinical note transcription with the patient, who gave verbal consent to proceed.  History of Present Illness Erika Parsons is a 63 year old female who presents with a suspicious spot on her arm.  Approximately three weeks ago, she noticed a small, white, raised spot on her arm, initially resembling a 'pinprick' or 'spider bite'. Over time, the spot has grown, changed in appearance, and become painful. It now has a hard center with a scab-like middle.  She has been applying Neosporin and covering it with a Band-Aid, but unlike other minor skin issues she has experienced, this spot has not resolved and continues to worsen and grow over the 3 weeks.  Review of Systems  Constitutional: Negative.   HENT: Negative.    Eyes: Negative.   Respiratory:  Negative for cough, chest tightness and shortness of breath.   Cardiovascular:  Negative for chest pain, palpitations and leg swelling.  Gastrointestinal:  Negative for abdominal distention, abdominal pain, constipation, diarrhea, nausea and vomiting.  Musculoskeletal: Negative.   Skin:  Positive for wound.  Neurological: Negative.   Psychiatric/Behavioral: Negative.      Objective:  Physical Exam Constitutional:      Appearance: She is well-developed.  HENT:     Head: Normocephalic and atraumatic.  Cardiovascular:     Rate and Rhythm: Normal rate and regular rhythm.  Pulmonary:     Effort: Pulmonary effort is normal. No respiratory distress.     Breath sounds: Normal breath sounds. No wheezing or rales.  Abdominal:     General: Bowel sounds are normal. There is no distension.     Palpations: Abdomen is soft.     Tenderness: There is no abdominal tenderness.  Musculoskeletal:     Cervical back: Normal range of motion.  Skin:    General: Skin is warm and dry.     Comments: Lesion right  forearm with rolled edges and central scab without purulence  Neurological:     Mental Status: She is alert and oriented to person, place, and time.     Coordination: Coordination normal.     Vitals:   10/06/24 1102  BP: 130/80  Pulse: 74  Temp: 98.4 F (36.9 C)  TempSrc: Oral  SpO2: 98%  Weight: 134 lb (60.8 kg)  Height: 5' 4.5 (1.638 m)    Assessment and Plan Assessment & Plan Suspicious skin lesion, right arm   Lesion with rolled border and central scab, suspicious for skin cancer. She is seeing dermatologist later today for evaluation and likely excision.

## 2024-10-30 DIAGNOSIS — C44622 Squamous cell carcinoma of skin of right upper limb, including shoulder: Secondary | ICD-10-CM | POA: Diagnosis not present

## 2025-02-22 ENCOUNTER — Other Ambulatory Visit (HOSPITAL_BASED_OUTPATIENT_CLINIC_OR_DEPARTMENT_OTHER)

## 2025-03-09 ENCOUNTER — Ambulatory Visit: Admitting: Obstetrics and Gynecology

## 2025-03-29 ENCOUNTER — Ambulatory Visit: Admitting: Obstetrics and Gynecology
